# Patient Record
Sex: Male | Born: 1960
Health system: Southern US, Community
[De-identification: ages and names within clinical notes are randomized; demographics above are authoritative.]

## PROBLEM LIST (undated history)

## (undated) DIAGNOSIS — M1990 Unspecified osteoarthritis, unspecified site: Secondary | ICD-10-CM

## (undated) DIAGNOSIS — G4730 Sleep apnea, unspecified: Secondary | ICD-10-CM

## (undated) DIAGNOSIS — K635 Polyp of colon: Secondary | ICD-10-CM

## (undated) DIAGNOSIS — E781 Pure hyperglyceridemia: Secondary | ICD-10-CM

## (undated) DIAGNOSIS — R7303 Prediabetes: Secondary | ICD-10-CM

## (undated) DIAGNOSIS — F419 Anxiety disorder, unspecified: Secondary | ICD-10-CM

## (undated) DIAGNOSIS — I10 Essential (primary) hypertension: Secondary | ICD-10-CM

## (undated) HISTORY — DX: Polyp of colon: K63.5

## (undated) HISTORY — PX: POLYPECTOMY: SHX149

## (undated) HISTORY — DX: Prediabetes: R73.03

## (undated) HISTORY — PX: COLONOSCOPY: SHX174

## (undated) HISTORY — PX: SCALP LACERATION REPAIR: SHX6089

## (undated) HISTORY — DX: Unspecified osteoarthritis, unspecified site: M19.90

## (undated) HISTORY — DX: Anxiety disorder, unspecified: F41.9

## (undated) HISTORY — DX: Sleep apnea, unspecified: G47.30

## (undated) HISTORY — DX: Essential (primary) hypertension: I10

---

## 2000-09-12 ENCOUNTER — Encounter (INDEPENDENT_AMBULATORY_CARE_PROVIDER_SITE_OTHER): Payer: Self-pay | Admitting: *Deleted

## 2001-01-12 ENCOUNTER — Encounter: Payer: Self-pay | Admitting: Family Medicine

## 2001-01-12 ENCOUNTER — Encounter (INDEPENDENT_AMBULATORY_CARE_PROVIDER_SITE_OTHER): Payer: Self-pay | Admitting: *Deleted

## 2001-01-12 ENCOUNTER — Ambulatory Visit (HOSPITAL_COMMUNITY): Admission: RE | Admit: 2001-01-12 | Discharge: 2001-01-12 | Payer: Self-pay | Admitting: Family Medicine

## 2004-09-16 ENCOUNTER — Ambulatory Visit: Payer: Self-pay | Admitting: Internal Medicine

## 2006-04-27 ENCOUNTER — Ambulatory Visit: Payer: Self-pay | Admitting: Internal Medicine

## 2007-04-23 ENCOUNTER — Ambulatory Visit: Payer: Self-pay | Admitting: Family Medicine

## 2007-04-24 ENCOUNTER — Telehealth (INDEPENDENT_AMBULATORY_CARE_PROVIDER_SITE_OTHER): Payer: Self-pay | Admitting: *Deleted

## 2007-05-01 ENCOUNTER — Ambulatory Visit: Payer: Self-pay | Admitting: Family Medicine

## 2008-02-12 ENCOUNTER — Ambulatory Visit: Payer: Self-pay | Admitting: Internal Medicine

## 2008-02-14 ENCOUNTER — Encounter (INDEPENDENT_AMBULATORY_CARE_PROVIDER_SITE_OTHER): Payer: Self-pay | Admitting: *Deleted

## 2008-02-14 LAB — CONVERTED CEMR LAB
ALT: 26 units/L (ref 0–53)
AST: 20 units/L (ref 0–37)
Albumin: 4.4 g/dL (ref 3.5–5.2)
Alkaline Phosphatase: 57 units/L (ref 39–117)
BUN: 17 mg/dL (ref 6–23)
Basophils Absolute: 0 10*3/uL (ref 0.0–0.1)
Basophils Relative: 0.7 % (ref 0.0–1.0)
Bilirubin, Direct: 0.1 mg/dL (ref 0.0–0.3)
CO2: 28 meq/L (ref 19–32)
Calcium: 9.6 mg/dL (ref 8.4–10.5)
Chloride: 106 meq/L (ref 96–112)
Cholesterol: 172 mg/dL (ref 0–200)
Creatinine, Ser: 1.1 mg/dL (ref 0.4–1.5)
Eosinophils Absolute: 0 10*3/uL (ref 0.0–0.7)
Eosinophils Relative: 1 % (ref 0.0–5.0)
GFR calc Af Amer: 92 mL/min
GFR calc non Af Amer: 76 mL/min
Glucose, Bld: 105 mg/dL — ABNORMAL HIGH (ref 70–99)
HCT: 44 % (ref 39.0–52.0)
HDL: 33.6 mg/dL — ABNORMAL LOW (ref >39.0)
Hemoglobin: 15.3 g/dL (ref 13.0–17.0)
Iron: 110 ug/dL (ref 42–165)
LDL Cholesterol: 109 mg/dL — ABNORMAL HIGH (ref 0–99)
Lymphocytes Relative: 24.4 % (ref 12.0–46.0)
MCHC: 34.8 g/dL (ref 30.0–36.0)
MCV: 85.2 fL (ref 78.0–100.0)
Monocytes Absolute: 0.3 10*3/uL (ref 0.1–1.0)
Monocytes Relative: 5.7 % (ref 3.0–12.0)
Neutro Abs: 3 10*3/uL (ref 1.4–7.7)
Neutrophils Relative %: 68.2 % (ref 43.0–77.0)
PSA: 0.72 ng/mL (ref 0.10–4.00)
Platelets: 170 10*3/uL (ref 150–400)
Potassium: 4.4 meq/L (ref 3.5–5.1)
RBC: 5.17 M/uL (ref 4.22–5.81)
RDW: 12.5 % (ref 11.5–14.6)
Saturation Ratios: 26.8 % (ref 20.0–50.0)
Sodium: 141 meq/L (ref 135–145)
TSH: 1.61 microintl units/mL (ref 0.35–5.50)
Total Bilirubin: 1 mg/dL (ref 0.3–1.2)
Total CHOL/HDL Ratio: 5.1
Total Protein: 7.3 g/dL (ref 6.0–8.3)
Transferrin: 293.1 mg/dL (ref >=212.0)
Triglycerides: 145 mg/dL (ref 0–149)
VLDL: 29 mg/dL (ref 0–40)
WBC: 4.4 10*3/uL — ABNORMAL LOW (ref 4.5–10.5)

## 2008-12-03 HISTORY — PX: KNEE SURGERY: SHX244

## 2008-12-16 ENCOUNTER — Encounter: Payer: Self-pay | Admitting: Internal Medicine

## 2008-12-16 LAB — CONVERTED CEMR LAB
ALT: 119 units/L
AST: 60 units/L
Albumin: 4.8 g/dL
Alkaline Phosphatase: 86 units/L
BUN: 19 mg/dL
Cholesterol: 207 mg/dL
Creatinine, Ser: 1 mg/dL
Glucose, Bld: 101 mg/dL
HDL: 44 mg/dL
LDL Cholesterol: 119 mg/dL
Total Bilirubin: 0.4 mg/dL
Total Protein: 7.4 g/dL
Triglycerides: 213 mg/dL

## 2009-02-27 ENCOUNTER — Ambulatory Visit: Payer: Self-pay | Admitting: Internal Medicine

## 2009-03-04 ENCOUNTER — Telehealth (INDEPENDENT_AMBULATORY_CARE_PROVIDER_SITE_OTHER): Payer: Self-pay | Admitting: *Deleted

## 2009-03-04 ENCOUNTER — Encounter (INDEPENDENT_AMBULATORY_CARE_PROVIDER_SITE_OTHER): Payer: Self-pay | Admitting: *Deleted

## 2009-03-11 LAB — CONVERTED CEMR LAB
ALT: 22 units/L (ref 0–53)
AST: 21 units/L (ref 0–37)
Albumin: 4.3 g/dL (ref 3.5–5.2)
Alkaline Phosphatase: 59 units/L (ref 39–117)
BUN: 14 mg/dL (ref 6–23)
Basophils Absolute: 0 10*3/uL (ref 0.0–0.1)
Basophils Relative: 0.3 % (ref 0.0–3.0)
Bilirubin, Direct: 0 mg/dL (ref 0.0–0.3)
CO2: 29 meq/L (ref 19–32)
Calcium: 9.4 mg/dL (ref 8.4–10.5)
Chloride: 108 meq/L (ref 96–112)
Creatinine, Ser: 1 mg/dL (ref 0.4–1.5)
Eosinophils Absolute: 0 10*3/uL (ref 0.0–0.7)
Eosinophils Relative: 0.7 % (ref 0.0–5.0)
GFR calc non Af Amer: 84.72 mL/min (ref >60)
Glucose, Bld: 96 mg/dL (ref 70–99)
HCT: 40.8 % (ref 39.0–52.0)
Hemoglobin: 14 g/dL (ref 13.0–17.0)
Lymphocytes Relative: 24 % (ref 12.0–46.0)
Lymphs Abs: 1.2 10*3/uL (ref 0.7–4.0)
MCHC: 34.4 g/dL (ref 30.0–36.0)
MCV: 86.2 fL (ref 78.0–100.0)
Monocytes Absolute: 0.3 10*3/uL (ref 0.1–1.0)
Monocytes Relative: 6.2 % (ref 3.0–12.0)
Neutro Abs: 3.4 10*3/uL (ref 1.4–7.7)
Neutrophils Relative %: 68.8 % (ref 43.0–77.0)
PSA: 0.9 ng/mL (ref 0.10–4.00)
Platelets: 164 10*3/uL (ref 150.0–400.0)
Potassium: 3.9 meq/L (ref 3.5–5.1)
RBC: 4.73 M/uL (ref 4.22–5.81)
RDW: 12 % (ref 11.5–14.6)
Sodium: 143 meq/L (ref 135–145)
Total Bilirubin: 1 mg/dL (ref 0.3–1.2)
Total Protein: 7.1 g/dL (ref 6.0–8.3)
WBC: 4.9 10*3/uL (ref 4.5–10.5)

## 2010-04-02 ENCOUNTER — Ambulatory Visit: Payer: Self-pay | Admitting: Internal Medicine

## 2010-04-02 ENCOUNTER — Encounter: Payer: Self-pay | Admitting: Internal Medicine

## 2010-04-02 ENCOUNTER — Encounter (INDEPENDENT_AMBULATORY_CARE_PROVIDER_SITE_OTHER): Payer: Self-pay | Admitting: *Deleted

## 2010-04-09 LAB — CONVERTED CEMR LAB
ALT: 29 units/L (ref 0–53)
AST: 23 units/L (ref 0–37)
Albumin: 4.6 g/dL (ref 3.5–5.2)
Alkaline Phosphatase: 69 units/L (ref 39–117)
BUN: 16 mg/dL (ref 6–23)
Basophils Absolute: 0 10*3/uL (ref 0.0–0.1)
Basophils Relative: 0.4 % (ref 0.0–3.0)
Bilirubin, Direct: 0.1 mg/dL (ref 0.0–0.3)
CO2: 26 meq/L (ref 19–32)
Calcium: 9.7 mg/dL (ref 8.4–10.5)
Chloride: 104 meq/L (ref 96–112)
Cholesterol: 200 mg/dL (ref 0–200)
Creatinine, Ser: 1 mg/dL (ref 0.4–1.5)
Eosinophils Absolute: 0.1 10*3/uL (ref 0.0–0.7)
Eosinophils Relative: 1.1 % (ref 0.0–5.0)
GFR calc non Af Amer: 86.32 mL/min (ref >60)
Glucose, Bld: 95 mg/dL (ref 70–99)
HCT: 44.5 % (ref 39.0–52.0)
HDL: 34.3 mg/dL — ABNORMAL LOW (ref >39.00)
Hemoglobin: 15.4 g/dL (ref 13.0–17.0)
LDL Cholesterol: 133 mg/dL — ABNORMAL HIGH (ref 0–99)
Lymphocytes Relative: 21.8 % (ref 12.0–46.0)
Lymphs Abs: 1.3 10*3/uL (ref 0.7–4.0)
MCHC: 34.5 g/dL (ref 30.0–36.0)
MCV: 86.8 fL (ref 78.0–100.0)
Monocytes Absolute: 0.4 10*3/uL (ref 0.1–1.0)
Monocytes Relative: 6.2 % (ref 3.0–12.0)
Neutro Abs: 4.3 10*3/uL (ref 1.4–7.7)
Neutrophils Relative %: 70.5 % (ref 43.0–77.0)
PSA: 0.89 ng/mL (ref 0.10–4.00)
Platelets: 174 10*3/uL (ref 150.0–400.0)
Potassium: 4.2 meq/L (ref 3.5–5.1)
RBC: 5.13 M/uL (ref 4.22–5.81)
RDW: 13.4 % (ref 11.5–14.6)
Sodium: 140 meq/L (ref 135–145)
TSH: 1.94 microintl units/mL (ref 0.35–5.50)
Total Bilirubin: 1 mg/dL (ref 0.3–1.2)
Total CHOL/HDL Ratio: 6
Total Protein: 7.6 g/dL (ref 6.0–8.3)
Triglycerides: 163 mg/dL — ABNORMAL HIGH (ref 0.0–149.0)
VLDL: 32.6 mg/dL (ref 0.0–40.0)
WBC: 6.1 10*3/uL (ref 4.5–10.5)

## 2010-05-06 ENCOUNTER — Telehealth: Payer: Self-pay | Admitting: Internal Medicine

## 2010-05-18 ENCOUNTER — Ambulatory Visit: Payer: Self-pay | Admitting: Internal Medicine

## 2010-05-18 DIAGNOSIS — R195 Other fecal abnormalities: Secondary | ICD-10-CM | POA: Insufficient documentation

## 2010-06-29 ENCOUNTER — Telehealth: Payer: Self-pay | Admitting: Internal Medicine

## 2010-07-01 ENCOUNTER — Ambulatory Visit: Payer: Self-pay | Admitting: Internal Medicine

## 2010-07-05 ENCOUNTER — Encounter: Payer: Self-pay | Admitting: Internal Medicine

## 2010-08-18 ENCOUNTER — Telehealth: Payer: Self-pay | Admitting: Internal Medicine

## 2010-09-14 NOTE — Letter (Signed)
Summary: Primary Care Consult Scheduled Letter  Joe Blanchard at [place]  [address] [place], [place] [address]   Phone: [phone]  Fax: [phone]      04/02/2010 MRN: [id number]  [facility] [address] [place], [place]  [address]    Dear Joe Blanchard,    We have scheduled an appointment for you.  At the recommendation of Dr. Willow Ora, we have scheduled you a consult with Dr. Yancey Flemings of [facility] on 05-18-2010 at 2:45pm.  Their address is [address] Floor, [place] [address]. The office phone number is [phone].  If this appointment day and time is not convenient for you, please feel free to call the office of the doctor you are being referred to at the number listed above and reschedule the appointment.    It is important for you to keep your scheduled appointments. We are here to make sure you are given good patient care.   Thank you,    Renee, Patient Care Coordinator  at [facility]

## 2010-09-14 NOTE — Letter (Signed)
Summary: Patient Notice- Polyp Results  [facility]  [address] [place], [place] [address]   Phone: [phone]  Fax: [phone]        July 05, 2010 MRN: [id number]    Joe Blanchard [address] [place], [place]  [address]    Dear [name],  I am pleased to inform you that the colon polyp(s) removed during your recent colonoscopy was (were) found to be benign (no cancer detected) upon pathologic examination.  I recommend you have a repeat colonoscopy examination in ONE year to look for recurrent polyps, as having colon polyps increases your risk for having recurrent polyps or even colon cancer in the future.  Should you develop new or worsening symptoms of abdominal pain, bowel habit changes or bleeding from the rectum or bowels, please schedule an evaluation with either your primary care physician or with me.    Additional information/recommendations:  __ No further action with gastroenterology is needed at this time. Please      follow-up with your primary care physician for your other healthcare      needs.   Please call us if you are having persistent problems or have questions about your condition that have not been fully answered at this time.  Sincerely,  Hilarie Fredrickson MD  This letter has been electronically signed by your physician.  Appended Document: Patient Notice- Polyp Results Letter mailed

## 2010-09-14 NOTE — Procedures (Signed)
Summary: Colonoscopy   Colonoscopy  Procedure date:  09/12/2000  Findings:      Location:  [facility].  Results: Diverticulosis.        Patient Name: Joe Blanchard, Joe Blanchard MRN:  Procedure Procedures: Colonoscopy CPT: [phone].  Personnel: Endoscopist: Wilhemina Bonito. Marina Goodell, MD.  Referred By: Angelena Sole, MD.  Exam Location: Exam performed in Outpatient Clinic. Outpatient  Patient Consent: Procedure, Alternatives, Risks and Benefits discussed, consent obtained, from patient.  Indications  Evaluation of: Positive fecal occult blood test per digital rectal exam.  Symptoms: Abdominal pain / bloating.  Increased Risk Screening: Family History of Polyps.  History  Pre-Exam Physical: Performed Sep 12, 2000. Cardio-pulmonary exam, Rectal exam, HEENT exam , Abdominal exam, Extremity exam, Neurological exam, Mental status exam WNL.  Exam Exam: Extent of exam reached: Ileum, extent intended: Ileum.  The cecum was identified by appendiceal orifice and IC valve. Patient position: on left side. Colon retroflexion performed. Images were not taken. ASA Classification: I. Tolerance: excellent.  Monitoring: Pulse and BP monitoring, Oximetry used. Supplemental O2 given.  Colon Prep Used Golytely for colon prep. Prep results: excellent.  Sedation Meds: Fentanyl 100 mcg. Versed 10 mg.  Findings - DIVERTICULOSIS: Sigmoid Colon. ICD9: Diverticulosis, Colon: 562.10. Comments: mild diverticular change only.   Assessment Abnormal examination, see findings above.  Diagnoses: 562.10: Diverticulosis, Colon.   Comments: No explaination for symptoms  Events  Unplanned Interventions: No intervention was required.  Unplanned Events: There were no complications. Plans Medication Plan: Referring provider to order medications.  Patient Education: Patient given standard instructions for: Diverticulosis.  Disposition: After procedure patient sent to recovery. After recovery  patient sent home.  Comments: Return to the care of Dr. Ruthine Dose.  This report was created from the original endoscopy report, which was reviewed and signed by the above listed endoscopist.   cc:  Lutricia Horsfall, MD

## 2010-09-14 NOTE — Assessment & Plan Note (Signed)
Summary: cpx & lab/cbs/rescd cbs   Vital Signs:  Patient profile:   50 year old male Height:      71.25 inches Weight:      208 pounds BMI:     28.91 Pulse rate:   64 / minute Pulse rhythm:   regular BP sitting:   116 / 80  (left arm) Cuff size:   large  Vitals Entered By: Army Fossa CMA (April 02, 2010 8:07 AM)  CC: CPX: fasting   History of Present Illness: CPX  Current Medications (verified): 1)  Aspirin 81 Mg Tbec (Aspirin) .Marland Kitchen.. 1 By Mouth Once Daily  Allergies (verified): No Known Drug Allergies  Past History:  Past Medical History: Reviewed history from 02/12/2008 and no changes required. no  Past Surgical History: Reviewed history from 02/27/2009 and no changes required. left knee scope 12/03/2008  Family History: Reviewed history from 02/12/2008 and no changes required. 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 CV:  Denies chest pain or discomfort and swelling of feet. Resp:  Denies cough and shortness of breath. GI:  Denies bloody stools, diarrhea, nausea, and vomiting. GU:  Denies dysuria, hematuria, urinary frequency, and urinary hesitancy. Psych:  ++ stress , work, family life  .  Physical Exam  General:  alert, well-developed, and well-nourished.   Eyes:  no pale or icteric Neck:  no masses, no thyromegaly, and normal carotid upstroke.   Lungs:  normal respiratory effort, no intercostal retractions, no accessory muscle use, and normal breath sounds.   Heart:  normal rate, regular rhythm, no murmur, and no gallop.   Abdomen:  soft, non-tender, no distention,  no masses, no guarding, and no rigidity.   Rectal:  No external abnormalities noted. Normal sphincter tone. No rectal masses or tenderness. hemocult  + Prostate:  Prostate gland firm and smooth, no enlargement, nodularity, tenderness, mass, asymmetry or induration. Extremities:  no lower extremity edema Psych:  Cognition and judgment appear intact. Alert and cooperative with normal attention span and concentration.  not anxious appearing and not depressed appearing.     Impression & Recommendations:  Problem # 1:  HEALTH SCREENING (ICD-V70.0) Td 2002   has a family history of prostate cancer --labs   + FH colon cancer, he was somehow reluctant to have a Cscope d/t cost however  today he is Hemoccult positive; last colonoscopy about 9 years ago. Refer to GI. told patient to call if he doesn't hear from [place] in 1 week    He has gained several pounds ; states that one of his problems is eating out. Counseled. encouraged to continue walking daily  Orders: Venipuncture (78469) TLB-BMP (Basic Metabolic Panel-BMET) (80048-METABOL) TLB-CBC Platelet - w/Differential (85025-CBCD) TLB-Hepatic/Liver Function Pnl (80076-HEPATIC) TLB-Lipid Panel (80061-LIPID) TLB-TSH (Thyroid Stimulating Hormone) (84443-TSH) TLB-PSA (Prostate Specific Antigen) (84153-PSA) Gastroenterology Referral (GI)  Complete Medication List: 1)  Aspirin 81 Mg Tbec (Aspirin) .Marland Kitchen.. 1 by mouth once daily  Patient Instructions: 1)  Please schedule a follow-up appointment in 1 year.    Risk Factors:  Alcohol use:  yes

## 2010-09-14 NOTE — Letter (Signed)
Summary: [facility] Instructions  [facility]  [address] [place], [place] [address]   Phone: [phone]  Fax: [phone]       REID REGAS    December 08, 1960    MRN: [id number]        Procedure Day /Date:THURSDAY 07/01/10     Arrival Time:12:30 PM     Procedure Time:1:30 PM     Location of Procedure:                    X  [facility] (4th Floor)  PREPARATION FOR COLONOSCOPY WITH MOVIPREP   Starting 5 days prior to your procedure 06/26/10 do not eat nuts, seeds, popcorn, corn, beans, peas,  salads, or any raw vegetables.  Do not take any fiber supplements (e.g. Metamucil, Citrucel, and Benefiber).  THE DAY BEFORE YOUR PROCEDURE         DATE: 06/30/10  DAY: WEDNESDAY  1.  Drink clear liquids the entire day-NO SOLID FOOD  2.  Do not drink anything colored red or purple.  Avoid juices with pulp.  No orange juice.  3.  Drink at least 64 oz. (8 glasses) of fluid/clear liquids during the day to prevent dehydration and help the prep work efficiently.  CLEAR LIQUIDS INCLUDE: Water Jello Ice Popsicles Tea (sugar ok, no milk/cream) Powdered fruit flavored drinks Coffee (sugar ok, no milk/cream) Gatorade Juice: apple, white grape, white cranberry  Lemonade Clear bullion, consomm, broth Carbonated beverages (any kind) Strained chicken noodle soup Hard Candy                             4.  In the morning, mix first dose of MoviPrep solution:    Empty 1 Pouch A and 1 Pouch B into the disposable container    Add lukewarm drinking water to the top line of the container. Mix to dissolve    Refrigerate (mixed solution should be used within 24 hrs)  5.  Begin drinking the prep at 5:00 p.m. The MoviPrep container is divided by 4 marks.   Every 15 minutes drink the solution down to the next mark (approximately 8 oz) until the full liter is complete.   6.  Follow completed prep with 16 oz of clear liquid of your choice (Nothing red or purple).   Continue to drink clear liquids until bedtime.  7.  Before going to bed, mix second dose of MoviPrep solution:    Empty 1 Pouch A and 1 Pouch B into the disposable container    Add lukewarm drinking water to the top line of the container. Mix to dissolve    Refrigerate  THE DAY OF YOUR PROCEDURE      DATE: 07/01/10 ONG:EXBMWUXL  Beginning at 8:30 a.m. (5 hours before procedure):         1. Every 15 minutes, drink the solution down to the next mark (approx 8 oz) until the full liter is complete.  2. Follow completed prep with 16 oz. of clear liquid of your choice.    3. You may drink clear liquids until 11:30 AM (2 HOURS BEFORE PROCEDURE).   MEDICATION INSTRUCTIONS  Unless otherwise instructed, you should take regular prescription medications with a small sip of water   as early as possible the morning of your procedure.           OTHER INSTRUCTIONS  You will need a responsible adult at least 50 years of age  to accompany you and drive you home.   This person must remain in the waiting room during your procedure.  Wear loose fitting clothing that is easily removed.  Leave jewelry and other valuables at home.  However, you may wish to bring a book to read or  an iPod/MP3 player to listen to music as you wait for your procedure to start.  Remove all body piercing jewelry and leave at home.  Total time from sign-in until discharge is approximately 2-3 hours.  You should go home directly after your procedure and rest.  You can resume normal activities the  day after your procedure.  The day of your procedure you should not:   Drive   Make legal decisions   Operate machinery   Drink alcohol   Return to work  You will receive specific instructions about eating, activities and medications before you leave.    The above instructions have been reviewed and explained to me by   _______________________    I fully understand and can verbalize these instructions  _____________________________ Date _________

## 2010-09-14 NOTE — Assessment & Plan Note (Signed)
Summary: Screening colonoscopy / heme positive stool   History of Present Illness Visit Type: Initial Consult Primary GI MD: Yancey Flemings MD Primary Provider: Willow Ora, MD Requesting Provider: Willow Ora, MD Chief Complaint: Heme pos stools with family history of colon cancer. Pt denies any GI sx.  History of Present Illness:   Pleasant 50 year old with no significant past medical history. He sent today regarding the interval development of colon cancer in his family as well as Hemoccult-positive stool on physical exam. The patient was last seen in 2002 and he was referred for colonoscopy to evaluate Hemoccult-positive stool. The examination was normal except for diverticulosis. Since that time, his father was diagnosed with colon cancer at age 33 and died from the disease at age 29. Patient recently underwent routine physical exam including digital rectal. Trace blood on digital rectal noted. Patient's laboratories, including hemoglobin of 15.4, were normal. He does take a baby aspirin daily for "health reasons". His GI review of systems is entirely negative.Marland Kitchen   GI Review of Systems      Denies abdominal pain, acid reflux, belching, bloating, chest pain, dysphagia with liquids, dysphagia with solids, heartburn, loss of appetite, nausea, vomiting, vomiting blood, weight loss, and  weight gain.      Reports heme positive stool.     Denies anal fissure, black tarry stools, change in bowel habit, constipation, diarrhea, diverticulosis, fecal incontinence, hemorrhoids, irritable bowel syndrome, jaundice, light color stool, liver problems, rectal bleeding, and  rectal pain.    Current Medications (verified): 1)  Aspirin 81 Mg Tbec (Aspirin) .Marland Kitchen.. 1 By Mouth Once Daily  Allergies (verified): No Known Drug Allergies  Past History:  Past Medical History: no Anxiety Disorder  Past Surgical History: Reviewed history from 02/27/2009 and no changes required. left knee scope 12/03/2008  Family  History: HTN - no stroke - no DM - no MI - GF (old) colon Ca - father dx at age 51 prostate Ca- GF, F colon polyps--brother, sister sister-- + lupus anticoagulant  Social History: Reviewed history from 04/02/2010 and no changes required. 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  The patient denies allergy/sinus, anemia, arthritis/joint pain, back pain, blood in urine, breast changes/lumps, change in vision, confusion, cough, coughing up blood, depression-new, fainting, fatigue, fever, headaches-new, hearing problems, heart murmur, heart rhythm changes, itching, menstrual pain, muscle pains/cramps, night sweats, nosebleeds, pregnancy symptoms, shortness of breath, skin rash, sore throat, swelling of feet/legs, swollen lymph glands, thirst - excessive , urination - excessive , urination changes/pain, urine leakage, vision changes, and voice change.    Vital Signs:  Patient profile:   50 year old male Height:      71.25 inches Weight:      212 pounds BMI:     29.47 Pulse rate:   66 / minute Pulse rhythm:   regular BP sitting:   110 / 70  (right arm) Cuff size:   regular  Vitals Entered By: Christie Nottingham CMA Duncan Dull) (May 18, 2010 2:51 PM)  Physical Exam  General:  Well developed, well nourished, no acute distress. Head:  Normocephalic and atraumatic. Eyes:  PERRLA, no icterus. Ears:  Normal auditory acuity. Nose:  No deformity, discharge,  or lesions. Mouth:  No deformity or lesions, dentition normal.  Neck:  Supple; no masses or thyromegaly. Lungs:  Clear throughout to auscultation. Heart:  Regular rate and rhythm; no murmurs, rubs,  or bruits. Abdomen:  Soft, nontender and nondistended. No masses, hepatosplenomegaly or hernias noted. Normal  bowel sounds. Rectal:  deferred until colonoscopy Msk:  Symmetrical with no gross deformities. Normal posture. Pulses:  Normal pulses noted. Extremities:  No clubbing, cyanosis, edema or deformities noted. Neurologic:  Alert and  oriented x4. Skin:  Intact without significant lesions or rashes. Psych:  Alert and cooperative. Normal mood and affect.   Impression & Recommendations:  Problem # 1:  FAMILY HX COLON CANCER (ICD-V16.0)  colon cancer in first-degree relative at age 24. Patient is high risk screening  Problem # 2:  SCREENING COLORECTAL-CANCER (ICD-V76.12)  near 50 year old with colon cancer in a first-degree relative. Last colonoscopy close to a decade ago. Appropriate candidate for high-risk screening without contraindication. The nature of the procedure as well as the risks, benefits, and alternatives were reviewed. He understood and agreed to proceed. Movi prep prescribed. Patient instructed on its use  Problem # 3:  FECAL OCCULT BLOOD (ICD-792.1)  trace Hemoccult-positive stool on digital rectal exam. Negative GI review of systems. Normal hemoglobin. On aspirin. Clinical significance uncertain. May be secondary to digital trauma, aspirin, or occult GI pathology.  Plan: #1. Can evaluate further with colonoscopy that is being performed for screening purposes  Patient Instructions: 1)  Colonoscopy [facility] 07/01/10 1:30 pm arrive at 12:30 pm 2)  Movi prep instructions given 3)  Movi prep Rx. sent to pharmacy. 4)  Colonoscopy and Flexible Sigmoidoscopy brochure given.  5)  Copy sent to : Willow Ora, MD 6)  The medication list was reviewed and reconciled.  All changed / newly prescribed medications were explained.  A complete medication list was provided to the patient / caregiver. Prescriptions: MOVIPREP 100 GM  SOLR (PEG-KCL-NACL-NASULF-NA ASC-C) As per prep instructions.  #1 x 0   Entered by:   Milford Cage NCMA   Authorized by:   Hilarie Fredrickson MD   Signed by:   Milford Cage  NCMA on 05/18/2010   Method used:   Electronically to        [facility] [phone]* (retail)       [address]       [place], [place]  [address]       Ph: [phone]       Fax: [phone]   RxID:   [id number]

## 2010-09-14 NOTE — Progress Notes (Signed)
Summary: Procedure Question  Phone Note Call from Patient Call back at Work Phone [phone]   Caller: Patient Call For: Dr. Marina Goodell Reason for Call: Talk to Nurse Details for Reason: Procedure Question Summary of Call: Patient has procedure scheduled for Thursday.  Ate a "Big Mac" last night.  Wants to know if that messes up anything.  Please call and advise. Initial call taken by: Schuyler Amor,  June 29, 2010 8:28 AM  Follow-up for Phone Call        Message left to call back.   Teryl Lucy RN  June 29, 2010 12:37 PM Pt. assured that sesame seeds on the bun will not interfere with colon but  he is to avoid any more. Follow-up by: Teryl Lucy RN,  June 29, 2010 4:30 PM

## 2010-09-14 NOTE — Progress Notes (Signed)
Summary: sch procedure?  Phone Note Call from Patient Call back at Work Phone [phone]   Caller: Patient Call For: Dr. Marina Goodell Reason for Call: Talk to Nurse Summary of Call: pt want to know if he can go ahead and sch a COL now before he has his ov with Dr. Marina Goodell so that he doesnt have to wait so long for the procedure, it would already be booked Initial call taken by: Vallarie Mare,  May 06, 2010 12:00 PM  Follow-up for Phone Call        Pt. assured that colon can be scheduled at the office visit to be done this year.He thought maybe there was no available dates for a couple of months. Follow-up by: Teryl Lucy RN,  May 06, 2010 12:43 PM

## 2010-09-14 NOTE — Letter (Signed)
Summary: New Patient letter  [facility]  [address] [place], [place] [address]   Phone: [phone]  Fax: [phone]       04/02/2010 MRN: [id number]  [facility] [address] [place], [place]  [address]  Dear Mr. Kellett,  Welcome to the Gastroenterology Division at [facility].    You are scheduled to see Dr.  Marina Goodell on 05-18-10 at 2:45pm on the 3rd floor at [facility], [address]  We ask that you try to arrive at our office 15 minutes prior to your appointment time to allow for check-in.  We would like you to complete the enclosed self-administered evaluation form prior to your visit and bring it with you on the day of your appointment.  We will review it with you.  Also, please bring a complete list of all your medications or, if you prefer, bring the medication bottles and we will list them.  Please bring your insurance card so that we may make a copy of it.  If your insurance requires a referral to see a specialist, please bring your referral form from your primary care physician.  Co-payments are due at the time of your visit and may be paid by cash, check or credit card.     Your office visit will consist of a consult with your physician (includes a physical exam), any laboratory testing he/she may order, scheduling of any necessary diagnostic testing (e.g. x-ray, ultrasound, CT-scan), and scheduling of a procedure (e.g. Endoscopy, Colonoscopy) if required.  Please allow enough time on your schedule to allow for any/all of these possibilities.    If you cannot keep your appointment, please call [phone] to cancel or reschedule prior to your appointment date.  This allows [place] the opportunity to schedule an appointment for another patient in need of care.  If you do not cancel or reschedule by 5 p.m. the business day prior to your appointment date, you will be charged a $50.00 late cancellation/no-show fee.    Thank you for choosing  [facility] for your medical needs.  We appreciate the opportunity to care for you.  Please visit [place] at our website  to learn more about our practice.                     Sincerely,                                                             The Gastroenterology Division

## 2010-09-14 NOTE — Procedures (Signed)
Summary: Colonoscopy  Patient: Joe Blanchard Note: All result statuses are Final unless otherwise noted.  Tests: (1) Colonoscopy (COL)   COL Colonoscopy           DONE     [facility]     [address]     [place], [place]  [address]           COLONOSCOPY PROCEDURE REPORT           PATIENT:  Joe Blanchard, Joe Blanchard  MR#:  [id number]     BIRTHDATE:  [birth year], 49 yrs. old  GENDER:  male     ENDOSCOPIST:  Wilhemina Bonito. Eda Keys, MD     REF. BY:  Willow Ora, MD     PROCEDURE DATE:  07/01/2010     PROCEDURE:  Colonoscopy with biopsies,     Colonoscopy with snare polypectomy     x 9     EXTENDED SERVICE FOR TIME (30     MINUTES) AND MULTIPLE POLYPS (9)     ASA CLASS:  Class I     INDICATIONS:  family history of colon cancer, screening, heme     positive stool ; father w/ CRC at 62     MEDICATIONS:   Fentanyl 100 mcg IV, Versed 10 mg IV, Benadryl 50     mg IV           DESCRIPTION OF PROCEDURE:   After the risks benefits and     alternatives of the procedure were thoroughly explained, informed     consent was obtained.  Digital rectal exam was performed and     revealed no abnormalities.   The LB CF-H180AL [id number] endoscope     was introduced through the anus and advanced to the cecum, which     was identified by both the appendix and ileocecal valve, without     limitations.Time to cecum = 1:58 min. The quality of the prep was     excellent, using MoviPrep.  The instrument was then slowly     withdrawn (time = 26:13 min) as the colon was fully examined.     <<PROCEDUREIMAGES>>           FINDINGS:  There were NINE polyps identified and removed from the     cecum (77mm,2mm), ascending (81mm,7mm), transverse (56mm,3mm,4mm) and     rectosigmoid (41mm,5mm) colon. Polyps were snared without cautery.     Retrieval was successful.  Also, Abnormal appearing mucosa in the     rectum along the second fold where one polyp was removed. Multiple     bx taken to r/o adenomatous tissue.  Mild  diverticulosis was found     in the sigmoid colon.  The terminal ileum appeared normal.     Retroflexed views in the rectum revealed no abnormalities.    The     scope was then withdrawn from the patient and the procedure     completed.           COMPLICATIONS:  None           ENDOSCOPIC IMPRESSION:     1) Polyps, multiple (9) - removed     2) Abnormal mucosa in the rectum - bx     3) Mild diverticulosis in the sigmoid colon     4) Normal terminal ileum           RECOMMENDATIONS:     1) Repeat Colonoscopy in 1 year (if rectal bx nonadenomatous).  ______________________________     Wilhemina Bonito. Eda Keys, MD           CC:  Willow Ora, MD; The Patient           n.     eSIGNED:   Wilhemina Bonito. Eda Keys at 07/01/2010 02:39 PM           Edmonds, Cillian, [place] [id number]  Note: An exclamation mark (!) indicates a result that was not dispersed into the flowsheet. Document Creation Date: 07/01/2010 2:39 PM _______________________________________________________________________  (1) Order result status: Final Collection or observation date-time: 07/01/2010 14:19 Requested date-time:  Receipt date-time:  Reported date-time:  Referring Physician:   Ordering Physician: Fransico Setters [phone]) Specimen Source:  Source: Launa Grill Order Number: [id number] Lab site:   Appended Document: Colonoscopy recall ONE yr     Procedures Next Due Date:    Colonoscopy: 06/2011

## 2010-09-16 NOTE — Progress Notes (Signed)
Summary: speak to nurse  Phone Note Call from Patient Call back at Work Phone [phone]   Caller: Patient Call For: Dr Marina Goodell Reason for Call: Talk to Nurse Summary of Call: Patient wants to speak to nurse regarding his results Initial call taken by: Tawni Levy,  August 18, 2010 2:26 PM  Follow-up for Phone Call        Message left to call back.   Teryl Lucy RN  August 18, 2010 2:31 PM  Explained path. results  AV:WUJW of inflammation. Follow-up by: Teryl Lucy RN,  August 18, 2010 2:42 PM

## 2011-04-20 ENCOUNTER — Encounter: Payer: Self-pay | Admitting: Internal Medicine

## 2011-04-21 ENCOUNTER — Encounter: Payer: Self-pay | Admitting: Internal Medicine

## 2011-04-21 ENCOUNTER — Ambulatory Visit (INDEPENDENT_AMBULATORY_CARE_PROVIDER_SITE_OTHER): Payer: 59 | Admitting: Internal Medicine

## 2011-04-21 DIAGNOSIS — Z0000 Encounter for general adult medical examination without abnormal findings: Secondary | ICD-10-CM | POA: Insufficient documentation

## 2011-04-21 DIAGNOSIS — F419 Anxiety disorder, unspecified: Secondary | ICD-10-CM | POA: Insufficient documentation

## 2011-04-21 DIAGNOSIS — Z23 Encounter for immunization: Secondary | ICD-10-CM

## 2011-04-21 DIAGNOSIS — F411 Generalized anxiety disorder: Secondary | ICD-10-CM

## 2011-04-21 LAB — COMPREHENSIVE METABOLIC PANEL
Albumin: 4.7 g/dL (ref 3.5–5.2)
CO2: 25 mEq/L (ref 19–32)
GFR: 111.85 mL/min (ref >60.00)
Glucose, Bld: 114 mg/dL — ABNORMAL HIGH (ref 70–99)
Sodium: 141 mEq/L (ref 135–145)
Total Bilirubin: 0.9 mg/dL (ref 0.3–1.2)
Total Protein: 7.6 g/dL (ref 6.0–8.3)

## 2011-04-21 LAB — CBC WITH DIFFERENTIAL/PLATELET
Eosinophils Relative: 0.9 % (ref 0.0–5.0)
HCT: 46.8 % (ref 39.0–52.0)
Hemoglobin: 15.8 g/dL (ref 13.0–17.0)
Lymphs Abs: 1.2 10*3/uL (ref 0.7–4.0)
Monocytes Relative: 6.1 % (ref 3.0–12.0)
Neutro Abs: 3.7 10*3/uL (ref 1.4–7.7)
Platelets: 177 10*3/uL (ref 150.0–400.0)
RBC: 5.42 Mil/uL (ref 4.22–5.81)
WBC: 5.3 10*3/uL (ref 4.5–10.5)

## 2011-04-21 LAB — LIPID PANEL
Cholesterol: 209 mg/dL — ABNORMAL HIGH (ref 0–200)
VLDL: 50.4 mg/dL — ABNORMAL HIGH (ref 0.0–40.0)

## 2011-04-21 LAB — TSH: TSH: 1.76 u[IU]/mL (ref 0.35–5.50)

## 2011-04-21 LAB — LDL CHOLESTEROL, DIRECT: Direct LDL: 129 mg/dL

## 2011-04-21 MED ORDER — CITALOPRAM HYDROBROMIDE 20 MG PO TABS: 20.0000 mg | ORAL_TABLET | Freq: Every day | ORAL | Status: DC

## 2011-04-21 NOTE — Progress Notes (Signed)
  Subjective:    Patient ID: Joe Blanchard, male    DOB: [birth year], 50 y.o.   MRN: [id number]  HPI CPX C/o increased stress at work, very anxious, unable to sleep more than 5 hours at night (this is a consistent issue) No depression but emotional, no suicidal. SSRI?  Past Medical History  Diagnosis Date  . Anxiety   . Colon polyps     Cscope 11-11, next 06-2011   Past Surgical History  Procedure Date  . 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 Fatigue: occ fatigue.  Respiratory: Negative for cough and wheezing.   Cardiovascular: Negative for chest pain and leg swelling.  Gastrointestinal: Negative for abdominal pain and blood in stool.  Genitourinary: Negative for dysuria, hematuria and difficulty urinating.  Psychiatric/Behavioral:               Objective:   Physical Exam  Constitutional: He is oriented to person, place, and time. He appears well-developed and well-nourished.  HENT:  Head: Normocephalic and atraumatic.  Neck: No thyromegaly present.  Cardiovascular: Normal rate, regular rhythm and normal heart sounds.   No murmur heard. Pulmonary/Chest: Effort normal and breath sounds normal. No respiratory distress. He has no wheezes. He has no rales.  Abdominal: Soft. He exhibits no distension. There is no tenderness. There is no rebound and no guarding.  Genitourinary: Rectum normal and prostate normal.  Musculoskeletal: He exhibits no edema.  Neurological: He is alert and oriented to person, place, and time.    Skin: Skin is warm and dry.  Psychiatric: He has a normal mood and affect. His behavior is normal. Judgment and thought content normal.          Assessment & Plan:

## 2011-04-21 NOTE — Assessment & Plan Note (Addendum)
New problem Anxiety, insomnia x > 1 year, job related We discussed treatment of insomnia only versus SSRIs, he favors SSRIs: start citalopram, s/e discussed. See instructions

## 2011-04-21 NOTE — Patient Instructions (Signed)
Start citalopram : 1/2 tablet a day x 1 week, then 1 tab a day. Take at bedtime

## 2011-04-21 NOTE — Assessment & Plan Note (Addendum)
Td 2002 and today + family history of prostate cancer --labs , normal DRE  + FH colon cancer, Cscope 11-11, next 11-12 or 12-2011? Pt not sure, will call gi Weight stable compared to last year; encouraged to continue  to eat healthy, exercise (has knee pain:knee sleeve?)

## 2011-04-25 ENCOUNTER — Telehealth: Payer: Self-pay | Admitting: *Deleted

## 2011-04-25 DIAGNOSIS — R739 Hyperglycemia, unspecified: Secondary | ICD-10-CM

## 2011-04-25 NOTE — Telephone Encounter (Signed)
Message copied by Regis Bill on Mon Apr 25, 2011  3:09 PM ------      Message from: Joe Blanchard      Created: Sun Apr 24, 2011 11:13 AM       advise patient      Sugar slightly elevated, please add a hemoglobin A1c ---DX hyperglycemia      Triglycerides 252, hydrocodone before, a healthy diet and daily exercise will help bring this number  down .      Other labs normal.

## 2011-04-25 NOTE — Telephone Encounter (Signed)
LMOM to inform Patient. A1C lab ordered. Results Mailed.

## 2011-04-26 ENCOUNTER — Telehealth: Payer: Self-pay

## 2011-04-26 NOTE — Telephone Encounter (Signed)
Dr. Drue Novel spoke with Dr. Marina Goodell regarding when the pt is due for his colon. Per Dr. Marina Goodell pt should be due in Nov 2012. Pt notified. Pt will receive a letter when due. Pt requests that we call him when Nov schedule opens up to schedule his appt.

## 2011-05-19 ENCOUNTER — Other Ambulatory Visit: Payer: Self-pay | Admitting: Internal Medicine

## 2011-05-19 DIAGNOSIS — R7309 Other abnormal glucose: Secondary | ICD-10-CM

## 2011-05-20 ENCOUNTER — Other Ambulatory Visit (INDEPENDENT_AMBULATORY_CARE_PROVIDER_SITE_OTHER): Payer: 59

## 2011-05-20 DIAGNOSIS — R7309 Other abnormal glucose: Secondary | ICD-10-CM

## 2011-05-20 LAB — HEMOGLOBIN A1C: Hgb A1c MFr Bld: 6.6 % — ABNORMAL HIGH (ref 4.6–6.5)

## 2011-05-20 NOTE — Progress Notes (Signed)
Labs only

## 2011-05-22 ENCOUNTER — Telehealth: Payer: Self-pay | Admitting: Internal Medicine

## 2011-05-22 DIAGNOSIS — R7303 Prediabetes: Secondary | ICD-10-CM

## 2011-05-22 NOTE — Telephone Encounter (Signed)
Advise patient: Labs did show mild DM; treatment is a healthy diet and exercise daily Plan: 1. Refer to a nutritionist 2. RTC 3 months for a ROV

## 2011-05-23 NOTE — Telephone Encounter (Signed)
Patient's wife informed. [Pt at work]. Nutritionist referral done. Pt will call back to schedule ROV.

## 2011-06-24 ENCOUNTER — Ambulatory Visit (INDEPENDENT_AMBULATORY_CARE_PROVIDER_SITE_OTHER): Payer: 59 | Admitting: Internal Medicine

## 2011-06-24 ENCOUNTER — Encounter: Payer: Self-pay | Admitting: Internal Medicine

## 2011-06-24 DIAGNOSIS — E119 Type 2 diabetes mellitus without complications: Secondary | ICD-10-CM

## 2011-06-24 DIAGNOSIS — E785 Hyperlipidemia, unspecified: Secondary | ICD-10-CM | POA: Insufficient documentation

## 2011-06-24 DIAGNOSIS — R7303 Prediabetes: Secondary | ICD-10-CM

## 2011-06-24 DIAGNOSIS — F419 Anxiety disorder, unspecified: Secondary | ICD-10-CM

## 2011-06-24 DIAGNOSIS — F411 Generalized anxiety disorder: Secondary | ICD-10-CM

## 2011-06-24 DIAGNOSIS — R739 Hyperglycemia, unspecified: Secondary | ICD-10-CM | POA: Insufficient documentation

## 2011-06-24 HISTORY — DX: Prediabetes: R73.03

## 2011-06-24 MED ORDER — ZOLPIDEM TARTRATE 10 MG PO TABS: 10.0000 mg | ORAL_TABLET | Freq: Every evening | ORAL | Status: DC | PRN

## 2011-06-24 NOTE — Assessment & Plan Note (Signed)
Triglycerides elevated, diet and exercise discussed

## 2011-06-24 NOTE — Patient Instructions (Signed)
"  Diabetes for Dummies", great book Exercise 30 minutes every day!

## 2011-06-24 NOTE — Assessment & Plan Note (Addendum)
Lengthy  discussion about what is diabetes, meaning an A1c, diet, exercise. Abundant informational material provided

## 2011-06-24 NOTE — Progress Notes (Signed)
  Subjective:    Patient ID: Joe Blanchard, male    DOB: [birth year], 50 y.o.   MRN: [id number]  HPI Followup from last office visit Started citalopram, it works very well. Still has issues with insomnia, is unable to sleep more than 4 hours a day. He was also diagnosed with diabetes with an A1c of 6.6. His triglycerides were elevated.  Past Medical History  Diagnosis Date  . Anxiety   . Colon polyps     Cscope 11-11, next 06-2011   Past Surgical History  Procedure Date  . Knee surgery 12/03/08    scope     Review of Systems     Objective:   Physical Exam  A, ox3, NAD      Assessment & Plan:  Today , I spent more than 25  min with the patient, >50% of the time counseling, and reviewing his previous labs with him

## 2011-06-24 NOTE — Assessment & Plan Note (Signed)
Improved RF citalopram Add Ambien to help sleep. We'll consider Ambien CR if plain zolpidem does not help because most of his difficulty sleeping is waking up early. Cost  may be an issue

## 2011-06-26 ENCOUNTER — Encounter: Payer: Self-pay | Admitting: Internal Medicine

## 2011-07-21 ENCOUNTER — Other Ambulatory Visit: Payer: Self-pay | Admitting: Internal Medicine

## 2011-07-26 ENCOUNTER — Ambulatory Visit (INDEPENDENT_AMBULATORY_CARE_PROVIDER_SITE_OTHER): Payer: 59 | Admitting: Internal Medicine

## 2011-07-26 DIAGNOSIS — F411 Generalized anxiety disorder: Secondary | ICD-10-CM

## 2011-07-26 DIAGNOSIS — Z23 Encounter for immunization: Secondary | ICD-10-CM

## 2011-07-26 DIAGNOSIS — E119 Type 2 diabetes mellitus without complications: Secondary | ICD-10-CM

## 2011-07-26 DIAGNOSIS — R209 Unspecified disturbances of skin sensation: Secondary | ICD-10-CM

## 2011-07-26 DIAGNOSIS — R202 Paresthesia of skin: Secondary | ICD-10-CM | POA: Insufficient documentation

## 2011-07-26 DIAGNOSIS — F419 Anxiety disorder, unspecified: Secondary | ICD-10-CM

## 2011-07-26 NOTE — Progress Notes (Signed)
  Subjective:    Patient ID: Joe Blanchard, male    DOB: 05-28-1961, 50 y.o.   MRN: [id number]  HPI 4 few weeks, he is experiencing a "funny feeling in my feet". They feel either hot at night or cold during the day. Denies feeling to be painful. 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 No problems walking. As far as his diabetes, he is eating healthier No back pain      Objective:   Physical Exam  Constitutional: He is oriented to person, place, and time. He appears well-developed and well-nourished.  Musculoskeletal: He exhibits no edema.       DIABETIC FEET EXAM: No lower extremity edema Normal pedal pulses bilaterally Skin and nails are normal without calluses Pinprick examination of the feet normal.   Neurological: He is alert and oriented to person, place, and time.  Psychiatric: He has a normal mood and affect. His behavior is normal. Judgment and thought content normal.      Assessment & Plan:  Today , I spent more than 25 min with the patient, >50% of the time counseling about paresthesias and DM

## 2011-07-26 NOTE — Assessment & Plan Note (Addendum)
Doing better w/  Life style A1C discussed Likes a nutritionist referral, likes to go to his wife

## 2011-07-26 NOTE — Patient Instructions (Signed)
Call if your symptoms persist Keep your follow up in few months

## 2011-07-26 NOTE — Assessment & Plan Note (Addendum)
Reports  Sx are well controlled at present, see "paresthesias"

## 2011-07-26 NOTE — Assessment & Plan Note (Signed)
Feet paresthesia Related to citalopram ? 1% of patients develop paresthesias Neuropathy? Sx are atypical, doubt neuropathy We discussed change citalopram vs observe He elected observe, will call if sx no better in few weeks

## 2011-08-19 ENCOUNTER — Ambulatory Visit (AMBULATORY_SURGERY_CENTER): Payer: 59 | Admitting: *Deleted

## 2011-08-19 ENCOUNTER — Encounter: Payer: Self-pay | Admitting: Internal Medicine

## 2011-08-19 VITALS — Ht 71.75 in | Wt 200.0 lb

## 2011-08-19 DIAGNOSIS — Z86010 Personal history of colonic polyps: Secondary | ICD-10-CM

## 2011-08-19 DIAGNOSIS — Z1211 Encounter for screening for malignant neoplasm of colon: Secondary | ICD-10-CM

## 2011-08-19 MED ORDER — PEG-KCL-NACL-NASULF-NA ASC-C 100 G PO SOLR: ORAL | Status: DC

## 2011-09-01 ENCOUNTER — Encounter: Payer: Self-pay | Admitting: Internal Medicine

## 2011-09-01 ENCOUNTER — Ambulatory Visit (AMBULATORY_SURGERY_CENTER): Payer: 59 | Admitting: Internal Medicine

## 2011-09-01 VITALS — BP 117/65 | HR 56 | Temp 97.9°F | Resp 16 | Ht 71.75 in | Wt 200.0 lb

## 2011-09-01 DIAGNOSIS — D126 Benign neoplasm of colon, unspecified: Secondary | ICD-10-CM

## 2011-09-01 DIAGNOSIS — Z860100 Personal history of colon polyps, unspecified: Secondary | ICD-10-CM

## 2011-09-01 DIAGNOSIS — Z1211 Encounter for screening for malignant neoplasm of colon: Secondary | ICD-10-CM

## 2011-09-01 MED ORDER — SODIUM CHLORIDE 0.9 % IV SOLN: 500.0000 mL | INTRAVENOUS | Status: DC

## 2011-09-01 NOTE — Patient Instructions (Signed)
Discharge instructions given with verbal understanding.  Handouts on polyps and diverticulosis given.  Resume previous medications. 

## 2011-09-01 NOTE — Op Note (Signed)
[facility] [address] [place], [place]  [address]  COLONOSCOPY PROCEDURE REPORT  PATIENT:  Joe, Blanchard  MR#:  [id number] BIRTHDATE:  1960-08-17, 50 yrs. old  GENDER:  male ENDOSCOPIST:  Wilhemina Bonito. Eda Keys, MD REF. BY:  Surveillance Program Recall, PROCEDURE DATE:  09/01/2011 PROCEDURE:  Colonoscopy with snare polypectomy x 2 ASA CLASS:  Class II INDICATIONS:  history of pre-cancerous (adenomatous) colon polyps, surveillance and high-risk screening, family history of colon cancer ; index exam 06-2010 w/ 9 adenomas; father @ 4 MEDICATIONS:   MAC sedation, administered by CRNA, propofol (Diprivan) 400 mg IV  DESCRIPTION OF PROCEDURE:   After the risks benefits and alternatives of the procedure were thoroughly explained, informed consent was obtained.  Digital rectal exam was performed and revealed no abnormalities.   The LB CF-H180AL [id number] endoscope was introduced through the anus and advanced to the cecum, which was identified by both the appendix and ileocecal valve, without limitations.  The quality of the prep was excellent, using MoviPrep.  The instrument was then slowly withdrawn as the colon was fully examined. <<PROCEDUREIMAGES>>  FINDINGS:  Two 4mm  polyps were found in the ascending colon and snared without cautery. Retrieval was successful.  Mild diverticulosis was found found scattered throught the colon. Otherwise normal colonoscopy without other polyps, masses, vascular ectasias, or inflammatory changes.   Retroflexed views in the rectum revealed no abnormalities.    The time to cecum = 1:43 minutes. The scope was then withdrawn in 12:02  minutes from the cecum and the procedure completed.  COMPLICATIONS:  None  ENDOSCOPIC IMPRESSION: 1) Two polyps in the ascending colon - recall 2) Mild diverticulosis found scattered throught the colon 3) Otherwise normal colonoscopy  RECOMMENDATIONS: 1) Repeat Colonoscopy in 3  years.  ______________________________ Wilhemina Bonito. Eda Keys, MD  CC:  Joe Ora, MD;  The Patient  n. eSIGNED:   Wilhemina Bonito. Eda Keys at 09/01/2011 09:34 AM  Joe Blanchard, Joe Blanchard, [id number]

## 2011-09-01 NOTE — Progress Notes (Signed)
Patient did not experience any of the following events: a burn prior to discharge; a fall within the facility; wrong site/side/patient/procedure/implant event; or a hospital transfer or hospital admission upon discharge from the facility. (G8907) Patient did not have preoperative order for IV antibiotic SSI prophylaxis. (G8918)  

## 2011-09-02 ENCOUNTER — Telehealth: Payer: Self-pay | Admitting: *Deleted

## 2011-09-02 NOTE — Telephone Encounter (Signed)

## 2011-09-06 ENCOUNTER — Encounter: Payer: Self-pay | Admitting: Internal Medicine

## 2011-09-26 ENCOUNTER — Telehealth: Payer: Self-pay | Admitting: Internal Medicine

## 2011-09-26 NOTE — Telephone Encounter (Signed)
thank you !!

## 2011-09-26 NOTE — Telephone Encounter (Signed)
Has an appt tomorrow morning at 9am-ss  [facility] Triage Call Report Triage Record Num: [id number] Operator: Revonda Humphrey Patient Name: Joe Blanchard Call Date & Time: 09/26/2011 11:02:05AM Patient Phone: [phone] PCP: Patient Gender: Male PCP Fax : Patient DOB: 05/24/1961 Practice Name: [facility] Reason for Call: Caller: [name]/Patient; PCP: Willow Ora; CB#: [phone]; ; ; Call regarding ?Marland Kitchen Sinus Infection.Marland Kitchen Headache for Several Weeks, Sore Throat Off and Cough.; Dull headache for weeks is worse at night, thinks may be sinus infection. Upper teeth pain, facial pain worse if bending over. Started with intermittent sore throat but that has finally stopped. Cough intermittently. Eyes feel hot in evenings for has not noted fever when temp taken. Taking Motrin, Tylenol QD for pain. No appt today, consult Lillia Abed in office - given appt 9:00am tomorrow. Protocol(s) Used: Diabetes: Respiratory Problems Recommended Outcome per Protocol: See Provider within 24 hours Reason for Outcome: Pain above or below eyes over sinuses (teeth/eyes may hurt) Mild to moderate headache for more than 24 hours unrelieved with nonprescription medications Care Advice:

## 2011-09-27 ENCOUNTER — Ambulatory Visit (INDEPENDENT_AMBULATORY_CARE_PROVIDER_SITE_OTHER): Payer: 59 | Admitting: Internal Medicine

## 2011-09-27 VITALS — BP 112/72 | HR 69 | Temp 98.5°F | Wt 199.0 lb

## 2011-09-27 DIAGNOSIS — E119 Type 2 diabetes mellitus without complications: Secondary | ICD-10-CM

## 2011-09-27 DIAGNOSIS — R519 Headache, unspecified: Secondary | ICD-10-CM | POA: Insufficient documentation

## 2011-09-27 DIAGNOSIS — R51 Headache: Secondary | ICD-10-CM

## 2011-09-27 MED ORDER — FLUTICASONE PROPIONATE 50 MCG/ACT NA SUSP: 2.0000 | Freq: Every day | NASAL | Status: DC

## 2011-09-27 MED ORDER — AMOXICILLIN-POT CLAVULANATE 500-125 MG PO TABS: 1.0000 | ORAL_TABLET | Freq: Three times a day (TID) | ORAL | Status: AC

## 2011-09-27 NOTE — Progress Notes (Signed)
  Subjective:    Patient ID: Joe Blanchard, male    DOB: 1960-10-28, 51 y.o.   MRN: [id number]  HPI Acute visit. Few weeks history of dull, daily headache, "behind eyes", has not been severe enough to stop doing any activities. has taken Tylenol and Advil with some help. He thinks he may be a sinus infection, see review of systems. Also, concerned about his recently diagnosed diabetes. He is exercising more, has lost some weight.  Past Medical History:  Mild diabetes A1C 6.6 --> 05-2011  Anxiety  Colon polyps    Past Surgical History:  left knee scope 12/03/2008  Review of Systems No fever or chills No nausea or vomiting In the last few weeks has had on and off runny nose, sore throat, cough, postnasal dripping. This morning he blew a small amount of nasal discharge with blood. Denies any head injury or diplopia. Headaches are slightly worse when he bends his torso over. He only takes one or 2 servings of caffeine daily. Mild neck pain without radiation. Some stress but citalopram is helping.    Objective:   Physical Exam  Constitutional: He is oriented to person, place, and time. He appears well-developed and well-nourished. No distress.  HENT:       Ears normal, throat without redness, nose slightly congested. Asymmetric and not tender to palpation    Eyes: EOM are normal. Pupils are equal, round, and reactive to light.  Neck: Normal range of motion. Neck supple.  Cardiovascular: Normal rate, regular rhythm and normal heart sounds.   No murmur heard. Pulmonary/Chest: Effort normal and breath sounds normal. No respiratory distress. He has no wheezes. He has no rales.  Musculoskeletal: He exhibits no edema.  Neurological: He is alert and oriented to person, place, and time.       Face symmetric, gait, motor and speech normal.  Skin: He is not diaphoretic.       Assessment & Plan:

## 2011-09-27 NOTE — Assessment & Plan Note (Signed)
Mild persistent HA associated w/ on-off respiratory sx , normal neuro exam DDX tensional, ethmoidal sinusitits, cervicogenic, others  Plan: treat sinusitis, if no better pt will call for further eval

## 2011-09-27 NOTE — Patient Instructions (Addendum)
As needed tylenol take Mucinex  twice a day x 1 week flonase x 1 month Take the antibiotic as prescribed ----> Augmentin Call if no better in 2-3 weeks  Call anytime if the symptoms are severe

## 2011-09-27 NOTE — Assessment & Plan Note (Signed)
a1c discussed Has not seen the dietician yet  Exercising more, has lost wt

## 2011-09-28 ENCOUNTER — Encounter: Payer: Self-pay | Admitting: Internal Medicine

## 2011-10-01 ENCOUNTER — Encounter: Payer: Self-pay | Admitting: Internal Medicine

## 2011-10-19 ENCOUNTER — Encounter: Payer: 59 | Attending: Internal Medicine | Admitting: *Deleted

## 2011-10-19 ENCOUNTER — Encounter: Payer: Self-pay | Admitting: *Deleted

## 2011-10-19 DIAGNOSIS — E119 Type 2 diabetes mellitus without complications: Secondary | ICD-10-CM | POA: Insufficient documentation

## 2011-10-19 DIAGNOSIS — Z713 Dietary counseling and surveillance: Secondary | ICD-10-CM | POA: Insufficient documentation

## 2011-10-19 NOTE — Progress Notes (Signed)
  Medical Nutrition Therapy:  Appt start time: 0830 end time:  0930.  Assessment:  Primary concerns today: patient here for diabetes education. 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 Family eats out often, he is active with his son doing core exercises every AM together. He states he recently has lost 10 pounds and his A1c has dropped from 6.6 to 5.8% in past 3 months also.   MEDICATIONS: see list   DIETARY INTAKE:  Usual eating pattern includes 3 meals and 2-3 snacks per day.  Everyday foods include good variety of all food groups.  Avoided foods include high carb foods lately due to new diagnosis of diabetes.    24-hr recall:  B ( AM):  Sweetened cereal, skim milk, fruit during the week OR eggs, bacon, toast, jelly, coffee, black Snk ( AM): apple or other fruit,   L ( PM): salad from home OR 6" sub OR BBQ sandwich and fries, diet soda or water  Snk ( PM): not usually, but gets very hungry D ( PM):eat out 4-5 times a week - cafeteria - vegetables, lean meat, no potatoes lately, likes beans Snk ( PM): leftovers, ice cream, (grazing) Beverages: coffee, water, sweet tea, diet soda  Usual physical activity: used to walk 40 minutes every morning, not now due to knee surgery (now does core exercises every morning 20 minutes)  Estimated energy needs: 1800 calories 200 g carbohydrates 135 g protein 50 g fat  Progress Towards Goal(s):  In progress.   Nutritional Diagnosis:  NB-1.1 Food and nutrition-related knowledge deficit As related to new diagnosis of diabetes.  As evidenced by excessi avoidance of high carb foods.   Intervention:  Nutrition counseling on basic physiology of diabetes, rationale of consistent carb intake, carb counting and reading food labels. Plan: Aim for 3-4 Carb choices (45 - 60 grams) / meal, 0-2 per snack if hungry Continue with daily core exercises every morning with son Read Food Labels for Total Carbohydrate of foods at  home   Handouts given during visit include:  Living Well with Diabetes  Carb Counting and Food Label handouts  Monitoring/Evaluation:  Dietary intake, exercise, read food labels, and body weight prn.

## 2011-10-19 NOTE — Patient Instructions (Addendum)
Plan: Aim for 3-4 Carb choices (45 - 60 grams) / meal, 0-2 per snack if hungry Continue with daily core exercises every morning with son Read Food Labels for Total Carbohydrate of foods at home

## 2011-10-22 ENCOUNTER — Other Ambulatory Visit: Payer: Self-pay | Admitting: Internal Medicine

## 2011-10-24 NOTE — Telephone Encounter (Signed)
Prescription sent to pharmacy.

## 2011-10-26 ENCOUNTER — Ambulatory Visit: Payer: 59 | Admitting: Internal Medicine

## 2011-10-28 ENCOUNTER — Telehealth: Payer: Self-pay | Admitting: *Deleted

## 2011-10-28 NOTE — Telephone Encounter (Signed)
Sounds like a thrombosed hemorrhoids, rec to go to the ER

## 2011-10-28 NOTE — Telephone Encounter (Signed)
[facility] Triage Call Report Triage Record Num: [id number] Operator: Chevis Pretty Patient Name: Eissa Buchberger Call Date & Time: 10/28/2011 10:22:52AM Patient Phone: [phone] PCP: Patient Gender: Male PCP Fax : Patient DOB: [birth year] Practice Name: [facility] Reason for Call: Caller: Rockie/Patient; PCP: Willow Ora; CB#: [phone]; ; ; Call regarding Hemorrhoids; onset ~ 2 weeks ago. Used wife's Proctasol but only has marginal relief with that. States is uncomfortable. States had colonscopy in January and was all clear and had no visible hemorrhoids at that time. States there is a palpable swelling "hanging down outside the rectum." Per protocol, emergnet symptoms denied; advised appt wihtin 72 hours. Unable to come to office 10/28/11 due to travel, but can come early 10/31/11 or 11/01/11. Appt sched in office 11/01/11 0900 with Dr. Drue Novel. Protocol(s) Used: Rectal Symptoms Recommended Outcome per Protocol: See Provider within 72 Hours Reason for Outcome: Pain in rectum or rectal area AND hemorrhoids that have not been evaluated or self care is not working Care Advice: ~ Call provider if symptoms worsen or new symptoms develop. ~ SYMPTOM / CONDITION MANAGEMENT ~ CAUTIONS ~ Avoid tight underclothing. Cotton underwear and loose clothing will help increase comfort. Hemorrhoid Comfort Measures: - Take warm sitz baths 2-3 times a day to decrease discomfort. - Consider nonprescription anti-inflammatory topical treatments (TUCKS, witch hazel, Preparation H, Nupercainal or Anusal HC ointment per label, pharmacist or provider recommendations). - Gently clean rectal area after bowel movement; avoid harsh cleansing soap or medications. - Use TUCKS, moist towelettes, or toilet tissue moistened with witch hazel to clean after bowel movement. - May apply cloth-covered ice pack to anal area for 10-15 minutes to relieve swelling. - Avoid prolonged sitting. Use donut  cushion as needed for sitting (available in most drug stores). - Avoid heavy lifting. - Call provider if having rectal bleeding; swollen, hard hemorrhoids, or severe rectal pain not responding to home care. ~ Call provider immediately if having constant, severe or worsening abdominal pain, cramping or rectal pain, vomiting, new more than scant rectal bleeding or bloody or black tarry stools or very thin, pencil-like stools. Do not take any laxatives until talking with provider. ~ 10/28/2011 10:33:33AM

## 2011-10-28 NOTE — Telephone Encounter (Signed)
LMOVM for pt to go to ER not urgent care.

## 2011-11-01 ENCOUNTER — Encounter: Payer: Self-pay | Admitting: Internal Medicine

## 2011-11-01 ENCOUNTER — Ambulatory Visit (INDEPENDENT_AMBULATORY_CARE_PROVIDER_SITE_OTHER): Payer: 59 | Admitting: Internal Medicine

## 2011-11-01 VITALS — BP 132/78 | HR 65 | Temp 98.8°F | Wt 201.0 lb

## 2011-11-01 DIAGNOSIS — K649 Unspecified hemorrhoids: Secondary | ICD-10-CM | POA: Insufficient documentation

## 2011-11-01 MED ORDER — HYDROCORTISONE 2.5 % EX OINT: TOPICAL_OINTMENT | Freq: Two times a day (BID) | CUTANEOUS | Status: DC

## 2011-11-01 NOTE — Patient Instructions (Signed)
Hemorrhoids Hemorrhoids are enlarged (dilated) veins around the rectum. There are 2 types of hemorrhoids, and the type of hemorrhoid is determined by its location. Internal hemorrhoids occur in the veins just inside the rectum.They are usually not painful, but they may bleed.However, they may poke through to the outside and become irritated and painful. External hemorrhoids involve the veins outside the anus and can be felt as a painful swelling or hard lump near the anus.They are often itchy and may crack and bleed. Sometimes clots will form in the veins. This makes them swollen and painful. These are called thrombosed hemorrhoids. CAUSES Causes of hemorrhoids include:  Pregnancy. This increases the pressure in the hemorrhoidal veins.   Constipation.   Straining to have a bowel movement.   Obesity.   Heavy lifting or other activity that caused you to strain.  TREATMENT Most of the time hemorrhoids improve in 1 to 2 weeks. However, if symptoms do not seem to be getting better or if you have a lot of rectal bleeding, your caregiver may perform a procedure to help make the hemorrhoids get smaller or remove them completely.Possible treatments include:  Rubber band ligation. A rubber band is placed at the base of the hemorrhoid to cut off the circulation.   Sclerotherapy. A chemical is injected to shrink the hemorrhoid.   Infrared light therapy. Tools are used to burn the hemorrhoid.   Hemorrhoidectomy. This is surgical removal of the hemorrhoid.  HOME CARE INSTRUCTIONS   Increase fiber in your diet. Ask your caregiver about using fiber supplements.   Drink enough water and fluids to keep your urine clear or pale yellow.   Exercise regularly.   Go to the bathroom when you have the urge to have a bowel movement. Do not wait.   Avoid straining to have bowel movements.   Keep the anal area dry and clean.   Only take over-the-counter or prescription medicines for pain,  discomfort, or fever as directed by your caregiver.  If your hemorrhoids are thrombosed:  Take warm sitz baths for 20 to 30 minutes, 3 to 4 times per day.   If the hemorrhoids are very tender and swollen, place ice packs on the area as tolerated. Using ice packs between sitz baths may be helpful. Fill a plastic bag with ice. Place a towel between the bag of ice and your skin.   Medicated creams and suppositories may be used or applied as directed.   Do not use a donut-shaped pillow or sit on the toilet for long periods. This increases blood pooling and pain.  SEEK MEDICAL CARE IF:   You have increasing pain and swelling that is not controlled with your medicine.   You have uncontrolled bleeding.   You have difficulty or you are unable to have a bowel movement.   You have pain or inflammation outside the area of the hemorrhoids.   You have chills or an oral temperature above 102 F (38.9 C).  MAKE SURE YOU:   Understand these instructions.   Will watch your condition.   Will get help right away if you are not doing well or get worse.  Document Released: 07/29/2000 Document Revised: 07/21/2011 Document Reviewed: 12/04/2007 [facility] Patient Information 2012 [place], [place].    Metamucil 2 caps daily with lots of fluids Colace OTC if needed for hard stools Mix nupercainal (OTC) and hydrocortisone 2.5 % 50-50 and apply to the hemorrhoid twice a day as needed  Call if pain, bleeding, swelling

## 2011-11-01 NOTE — Progress Notes (Signed)
  Subjective:    Patient ID: Joe Blanchard, male    DOB: 10/20/1960, 51 y.o.   MRN: [id number]  HPI Acute visit,  noted some anal discomfort 2 weeks ago, worse for the last 2 days. Discomfort is worse when he is sitting. He also noted a "knot" externally a few days ago.  Past Medical History:  Mild diabetes A1C 6.6 --> 05-2011  Anxiety  Colon polyps  Past Surgical History:  left knee scope 12/03/2008   Review of Systems No nausea, vomiting, constipation or abdominal pain. No blood in the stools.     Objective:   Physical Exam  Alert, oriented x3, no apparent distress. Abdomen is soft, nontender. Digital rectal exam: Externally he has a  1 cm, soft, nontender hemorrhoid at 2:00 oclock. The rest of exam is normal.      Assessment & Plan:   Hemorrhoid, see  Instructions Had a recent Cscope  Also was seen recently with a HA, he is feeling better, headache essentially resolved

## 2011-11-17 ENCOUNTER — Telehealth: Payer: Self-pay | Admitting: Internal Medicine

## 2011-11-17 MED ORDER — ZOLPIDEM TARTRATE 10 MG PO TABS: 10.0000 mg | ORAL_TABLET | Freq: Every evening | ORAL | Status: DC | PRN

## 2011-11-17 NOTE — Telephone Encounter (Signed)
OK for 30 only

## 2011-11-17 NOTE — Telephone Encounter (Signed)
Refill: Zolpidem tartrate 10 mg tablet. Take 1 tablet by mouth as needed for sleep.

## 2011-11-17 NOTE — Telephone Encounter (Signed)
#  30 with 3 refills. Last refilled on 11.9.2012. OK to refill?

## 2011-11-17 NOTE — Telephone Encounter (Signed)
Refill done.  

## 2011-12-21 ENCOUNTER — Telehealth: Payer: Self-pay | Admitting: Internal Medicine

## 2011-12-21 MED ORDER — ZOLPIDEM TARTRATE 10 MG PO TABS: 10.0000 mg | ORAL_TABLET | Freq: Every evening | ORAL | Status: DC | PRN

## 2011-12-21 NOTE — Telephone Encounter (Signed)
#

## 2011-12-21 NOTE — Telephone Encounter (Signed)
Refill: Zolpidem tartrate 10mg tablet. Take 1 tablet by mouth at bedtime as needed for sleep.  

## 2011-12-21 NOTE — Telephone Encounter (Signed)
Refill done.  

## 2011-12-21 NOTE — Telephone Encounter (Signed)
Ok to refill 

## 2011-12-28 ENCOUNTER — Ambulatory Visit: Payer: 59 | Admitting: Internal Medicine

## 2012-01-04 ENCOUNTER — Ambulatory Visit (INDEPENDENT_AMBULATORY_CARE_PROVIDER_SITE_OTHER): Payer: 59 | Admitting: Internal Medicine

## 2012-01-04 DIAGNOSIS — E785 Hyperlipidemia, unspecified: Secondary | ICD-10-CM

## 2012-01-04 DIAGNOSIS — E119 Type 2 diabetes mellitus without complications: Secondary | ICD-10-CM

## 2012-01-04 NOTE — Assessment & Plan Note (Addendum)
Here for a diabetes checkup, he is doing better w/  lifestyle. We discussed diet and exercise, the glycemic index, the need to eat less carbohydrates and more proteins. He could possibly benefit from the [employer]. He already saw a dietitian. Encouraged to continue with exercise.

## 2012-01-04 NOTE — Assessment & Plan Note (Signed)
Planning to check his cholesterol when he comes back in few months

## 2012-01-04 NOTE — Progress Notes (Signed)
  Subjective:    Patient ID: Joe Blanchard, male    DOB: May 02, 1961, 51 y.o.   MRN: [id number]  HPI Routine visit As far as his diabetes, his diet is still healthy, he is active, on average 3 hours a week.  Past Medical History:   Mild diabetes A1C 6.6 --> 05-2011   Anxiety   Colon polyps    Past Surgical History:   left knee scope 12/03/2008    Review of Systems Was seen w/  hemorrhoids, symptoms self subsided.    Objective:   Physical Exam  General -- alert, well-developed, and well-nourished.  Psych-- Cognition and judgment appear intact. Alert and cooperative with normal attention span and concentration.  not anxious appearing and not depressed appearing.        Assessment & Plan:

## 2012-01-04 NOTE — Patient Instructions (Signed)
recommend the book  [employer]

## 2012-01-05 ENCOUNTER — Encounter: Payer: Self-pay | Admitting: Internal Medicine

## 2012-01-05 ENCOUNTER — Encounter: Payer: Self-pay | Admitting: *Deleted

## 2012-02-23 ENCOUNTER — Other Ambulatory Visit: Payer: Self-pay | Admitting: Internal Medicine

## 2012-02-23 MED ORDER — ZOLPIDEM TARTRATE 10 MG PO TABS: 10.0000 mg | ORAL_TABLET | Freq: Every evening | ORAL | Status: DC | PRN

## 2012-02-23 NOTE — Telephone Encounter (Signed)
Ok 90, 0 RF

## 2012-02-23 NOTE — Telephone Encounter (Signed)
Ok to refill 

## 2012-02-23 NOTE — Telephone Encounter (Signed)
Refill Zolpidem Tartrate (Tab) AMBIEN 10 MG Take 1 tablet (10 mg total) by mouth at bedtime as needed for sleep.  Last ov 5.22.13  No last fill date, no last wrt date on meds list

## 2012-02-23 NOTE — Telephone Encounter (Signed)
Refill done,  

## 2012-05-26 ENCOUNTER — Other Ambulatory Visit: Payer: Self-pay | Admitting: Internal Medicine

## 2012-05-28 NOTE — Telephone Encounter (Signed)
Ok to refill 

## 2012-05-28 NOTE — Telephone Encounter (Signed)
done

## 2012-07-17 ENCOUNTER — Encounter: Payer: Self-pay | Admitting: Internal Medicine

## 2012-07-17 ENCOUNTER — Ambulatory Visit (INDEPENDENT_AMBULATORY_CARE_PROVIDER_SITE_OTHER): Payer: 59 | Admitting: Internal Medicine

## 2012-07-17 VITALS — BP 126/84 | HR 63 | Temp 97.9°F | Wt 205.0 lb

## 2012-07-17 DIAGNOSIS — R209 Unspecified disturbances of skin sensation: Secondary | ICD-10-CM

## 2012-07-17 DIAGNOSIS — F419 Anxiety disorder, unspecified: Secondary | ICD-10-CM

## 2012-07-17 DIAGNOSIS — F411 Generalized anxiety disorder: Secondary | ICD-10-CM

## 2012-07-17 DIAGNOSIS — R202 Paresthesia of skin: Secondary | ICD-10-CM

## 2012-07-17 DIAGNOSIS — E119 Type 2 diabetes mellitus without complications: Secondary | ICD-10-CM

## 2012-07-17 NOTE — Assessment & Plan Note (Addendum)
We again  discussed paresthesias, currently RLS seems more likely than neuropathy or a citalopram side effect. Plan: Observation for now + check B12 and folic acid; neurontin? Not interested on medication at this time

## 2012-07-17 NOTE — Assessment & Plan Note (Addendum)
Currently well controlled. Concerned about the long-term use of SSRI, I don't think that is an issue for him at this point, we agreed to reassess the need SSRI to every 6 months, for now he likes to continue on it. Still takes Ambien at night, despite the fact that he sleeps only 4-6 hours he feels rested.

## 2012-07-17 NOTE — Progress Notes (Signed)
  Subjective:    Patient ID: Joe Blanchard, male    DOB: Jun 21, 1961, 51 y.o.   MRN: [id number]  HPI Routine visit Insomnia, takes Ambien every night, he is concerned because he is only sleeping about 4-6 hours at night however he denies feeling tired, sleepy, energy level is normal. Snores only rarely. Diabetes, on diet and exercise, lifestyle continued to be satisfactory. Anxiety, well-controlled, again he is concerned about long-term use of citalopram, see assessment and plan, he is doing great as far as sx control Continue with paresthesias of his feet, mostly at night or when he's driving, can't describe well but feels "weird", numbness. No "burning" per se. Sx decrease  w/ moving his feet.  Past Medical History  Diagnosis Date  . Anxiety   . Colon polyps     Cscope 11-11, next 06-2011  . Diabetes mellitus dx 10-12   Past Surgical History  Procedure Date  . Knee surgery 12/03/08    scope   Review of Systems No nausea, vomiting or diarrhea No chest pain or shortness of breath     Objective:   Physical Exam General -- alert, well-developed, and well-nourished.   DIABETIC FEET EXAM: No lower extremity edema Normal pedal pulses bilaterally Skin and nails are normal without calluses Pinprick examination of the feet normal. Neurologic-- alert & oriented X3 and strength normal in all extremities. Psych-- Cognition and judgment appear intact. Alert and cooperative with normal attention span and concentration.  not anxious appearing and not depressed appearing.        Assessment & Plan:  Today , I spent more than 25 min with the patient, >50% of the time counseling about SSRIs, also paresthesias, see assessment and plan

## 2012-07-17 NOTE — Assessment & Plan Note (Addendum)
On diet control only, reports recent (-) eye exam negative  plan: Labs and continue with diet and exercise

## 2012-07-17 NOTE — Patient Instructions (Addendum)
Please get a flu shot at your pharmacy  Please come back fasting: BMP, A1c, microalbumin, FLP  ---- dx diabetes B12, folic acid ---- dx paresthesias  Schedule a physical exam 4 months from now

## 2012-07-18 ENCOUNTER — Ambulatory Visit: Payer: 59

## 2012-07-18 ENCOUNTER — Other Ambulatory Visit (INDEPENDENT_AMBULATORY_CARE_PROVIDER_SITE_OTHER): Payer: 59 | Admitting: *Deleted

## 2012-07-18 ENCOUNTER — Other Ambulatory Visit (INDEPENDENT_AMBULATORY_CARE_PROVIDER_SITE_OTHER): Payer: 59

## 2012-07-18 DIAGNOSIS — R209 Unspecified disturbances of skin sensation: Secondary | ICD-10-CM

## 2012-07-18 DIAGNOSIS — E119 Type 2 diabetes mellitus without complications: Secondary | ICD-10-CM

## 2012-07-18 DIAGNOSIS — Z23 Encounter for immunization: Secondary | ICD-10-CM

## 2012-07-18 DIAGNOSIS — Z0000 Encounter for general adult medical examination without abnormal findings: Secondary | ICD-10-CM

## 2012-07-18 DIAGNOSIS — R202 Paresthesia of skin: Secondary | ICD-10-CM

## 2012-07-18 LAB — BASIC METABOLIC PANEL
BUN: 17 mg/dL (ref 6–23)
Chloride: 102 mEq/L (ref 96–112)
Potassium: 4 mEq/L (ref 3.5–5.1)

## 2012-07-18 LAB — MICROALBUMIN / CREATININE URINE RATIO
Creatinine,U: 277.6 mg/dL
Microalb Creat Ratio: 0.7 mg/g (ref 0.0–30.0)
Microalb, Ur: 1.9 mg/dL (ref 0.0–1.9)

## 2012-07-18 LAB — HEMOGLOBIN A1C: Hgb A1c MFr Bld: 6.1 % (ref 4.6–6.5)

## 2012-07-18 LAB — FOLATE: Folate: >24.8 ng/mL (ref >5.9)

## 2012-07-18 LAB — LIPID PANEL: Cholesterol: 242 mg/dL — ABNORMAL HIGH (ref 0–200)

## 2012-08-29 ENCOUNTER — Other Ambulatory Visit: Payer: Self-pay | Admitting: Internal Medicine

## 2012-08-30 ENCOUNTER — Encounter: Payer: Self-pay | Admitting: *Deleted

## 2012-08-30 NOTE — Telephone Encounter (Signed)
done

## 2012-08-30 NOTE — Telephone Encounter (Signed)
Pt made aware rx & controlled substance contract is at front desk ready to be picked up.  

## 2012-08-30 NOTE — Telephone Encounter (Signed)
OK to refill? Last OV 12.3.13 Last filled 10.12.13

## 2012-09-18 ENCOUNTER — Encounter: Payer: Self-pay | Admitting: Lab

## 2012-09-19 ENCOUNTER — Encounter: Payer: Self-pay | Admitting: Internal Medicine

## 2012-09-19 ENCOUNTER — Ambulatory Visit (INDEPENDENT_AMBULATORY_CARE_PROVIDER_SITE_OTHER): Payer: 59 | Admitting: Internal Medicine

## 2012-09-19 VITALS — BP 112/78 | HR 64 | Temp 98.2°F | Ht 71.0 in | Wt 206.0 lb

## 2012-09-19 DIAGNOSIS — Z0000 Encounter for general adult medical examination without abnormal findings: Secondary | ICD-10-CM

## 2012-09-19 DIAGNOSIS — E119 Type 2 diabetes mellitus without complications: Secondary | ICD-10-CM

## 2012-09-19 DIAGNOSIS — F419 Anxiety disorder, unspecified: Secondary | ICD-10-CM

## 2012-09-19 LAB — CBC WITH DIFFERENTIAL/PLATELET
Basophils Relative: 0.3 % (ref 0.0–3.0)
Eosinophils Absolute: 0.1 10*3/uL (ref 0.0–0.7)
Hemoglobin: 15.2 g/dL (ref 13.0–17.0)
Lymphocytes Relative: 18.6 % (ref 12.0–46.0)
MCHC: 34.3 g/dL (ref 30.0–36.0)
Neutro Abs: 5 10*3/uL (ref 1.4–7.7)
RBC: 5.22 Mil/uL (ref 4.22–5.81)

## 2012-09-19 MED ORDER — FLUOXETINE HCL 20 MG PO TABS: 30.0000 mg | ORAL_TABLET | Freq: Every day | ORAL | Status: DC

## 2012-09-19 NOTE — Progress Notes (Signed)
  Subjective:    Patient ID: Joe Blanchard, male    DOB: [birth year], 52 y.o.   MRN: [id number]  HPI CPX  Past Medical History  Diagnosis Date  . Anxiety   . Colon polyps     Cscope 11-11, next 06-2011  . Diabetes mellitus dx 10-12   Past Surgical History  Procedure Date  . Knee surgery 12/03/08    scope   History   Social History  . Marital Status: Married    Spouse Name: N/A    Number of Children: N/A  . Years of Education: N/A   Occupational History  . car appraisal     Social History Main Topics  . Smoking status: Never Smoker   . Smokeless tobacco: Never Used  . Alcohol Use: 1.8 oz/week    3 Glasses of wine per week     Comment: socially   . Drug Use: No  . Sexually Active: Yes -- Male partner(s)   Other Topics Concern  . Not on file   Social History Narrative   Married, 2 children ---Exercise, some, not as much as before d/t knee pain ---Diet,    Family History  Problem Relation Age of Onset  . Colon cancer Father     dx in his 66s  . Lupus Sister     anticoagulant  . Heart disease Other   . Colon polyps Other   . Prostate cancer Father     dx in his 78s?  . CAD      GF  . Diabetes Neg Hx    Review of Systems Reports some fatigue, mostly in AM, feels better as the day goes by. He wonders if that may be related to some depression . Admits to some snoring particularly in the last year. He could easily fall asleep when watching TV at night but no other incidents. Not exercising much, has noted decrease in muscle mass in his legs. Denies problems with ED or decreased libido (except some decreased related to SSRIs). Denies chest pain or shortness of breath. No nausea, vomiting, diarrhea. No dysuria or gross hematuria    Objective:   Physical Exam  General -- alert, well-developed, BMI 28.   Neck --no thyromegaly  lungs -- normal respiratory effort, no intercostal retractions, no accessory muscle use, and normal breath sounds.   Heart--  normal rate, regular rhythm, no murmur, and no gallop.   Abdomen--soft, non-tender, no distention, no masses, no HSM, no guarding, and no rigidity.   Extremities-- no pretibial edema bilaterally Rectal-- No external abnormalities noted. Normal sphincter tone. No rectal masses or tenderness. Brown stool  Prostate:  Prostate gland firm and smooth, no enlargement, nodularity, tenderness, mass, asymmetry or induration. Neurologic-- alert & oriented X3 and strength normal in all extremities. Psych-- Cognition and judgment appear intact. Alert and cooperative with normal attention span and concentration.  not anxious appearing and not depressed appearing.       Assessment & Plan:

## 2012-09-19 NOTE — Assessment & Plan Note (Signed)
Last A1c satisfactory but  slightly higher than before. Plan is to improve lifestyle.

## 2012-09-19 NOTE — Patient Instructions (Addendum)
Start fluoxetine 20 mg  one tablet once a day for 2 weeks, then increase to 1.5 tablets daily. Please come back in 6 weeks fasting.

## 2012-09-19 NOTE — Assessment & Plan Note (Addendum)
Reports some fatigue in the mornings, decreased muscle mass, depression? His  PHQ 9 score is  11 (moderate depression). DDX for his sx includes OSA, hypogonadism, depression Plan: Will check a testosterone level  Switch SSRIs to prozac List of counselors provided

## 2012-09-19 NOTE — Assessment & Plan Note (Addendum)
Td 2012 + family history of prostate cancer -->  normal DRE today, check a PSA + FH colon cancer, Cscope 11-11 and 08-2011  Diet & exercise discussed Labs  C/o fatigue, see anxiety

## 2012-09-20 LAB — TESTOSTERONE, FREE, TOTAL, SHBG
Testosterone, Free: 42.4 pg/mL — ABNORMAL LOW (ref 47.0–244.0)
Testosterone-% Free: 2.3 % (ref 1.6–2.9)

## 2012-09-27 ENCOUNTER — Encounter: Payer: 59 | Admitting: Internal Medicine

## 2012-10-03 ENCOUNTER — Encounter: Payer: Self-pay | Admitting: *Deleted

## 2012-10-11 ENCOUNTER — Encounter: Payer: Self-pay | Admitting: Internal Medicine

## 2012-10-17 ENCOUNTER — Other Ambulatory Visit (INDEPENDENT_AMBULATORY_CARE_PROVIDER_SITE_OTHER): Payer: 59

## 2012-10-17 DIAGNOSIS — E291 Testicular hypofunction: Secondary | ICD-10-CM

## 2012-10-17 LAB — LUTEINIZING HORMONE: LH: 1.81 m[IU]/mL (ref 1.50–9.30)

## 2012-10-19 ENCOUNTER — Other Ambulatory Visit: Payer: Self-pay | Admitting: Internal Medicine

## 2012-10-19 DIAGNOSIS — E291 Testicular hypofunction: Secondary | ICD-10-CM

## 2012-10-31 ENCOUNTER — Ambulatory Visit: Payer: 59 | Admitting: Endocrinology

## 2012-11-01 ENCOUNTER — Ambulatory Visit (INDEPENDENT_AMBULATORY_CARE_PROVIDER_SITE_OTHER): Payer: 59 | Admitting: Internal Medicine

## 2012-11-01 VITALS — BP 118/82 | HR 62 | Wt 205.0 lb

## 2012-11-01 DIAGNOSIS — F419 Anxiety disorder, unspecified: Secondary | ICD-10-CM

## 2012-11-01 DIAGNOSIS — F411 Generalized anxiety disorder: Secondary | ICD-10-CM

## 2012-11-01 DIAGNOSIS — E291 Testicular hypofunction: Secondary | ICD-10-CM

## 2012-11-01 NOTE — Assessment & Plan Note (Addendum)
Weight change his SSRI to Prozac, and good compliance, no apparent side effects, he feels about the same, no improvment. He continue with insomnia. Also since the last time he was here she was diagnosed with hypogonadism. Plan:  Continue Prozac Reassess after hypogonadism is treated, I think that is contributing factor to his overall sense of not feeling well. Also, consider screening for sleep apnea Good sleeping habits discussed

## 2012-11-01 NOTE — Assessment & Plan Note (Signed)
Low testosterone with normal FSH, LH. To see endocrinology soon for further eval and treatment.

## 2012-11-01 NOTE — Progress Notes (Signed)
  Subjective:    Patient ID: Joe Blanchard, male    DOB: June 12, 1961, 52 y.o.   MRN: [id number]  HPI Followup Anxiety, depression. Switched  to Prozac, feeling about the same. Still feeling quite uptight early in the morning, "I feel better once I go to work and start working and get stuff done". He is extremely busy and sometimes thinks is "too much". No issues with concentration.  Past Medical History  Diagnosis Date  . Anxiety   . Colon polyps     Cscope 11-11, next 06-2011  . Diabetes mellitus dx 10-12   Past Surgical History  Procedure Laterality Date  . Knee surgery  12/03/08    scope   History   Social History  . Marital Status: Married    Spouse Name: N/A    Number of Children: N/A  . Years of Education: N/A   Occupational History  . car appraisal     Social History Main Topics  . Smoking status: Never Smoker   . Smokeless tobacco: Never Used  . Alcohol Use: 1.8 oz/week    3 Glasses of wine per week     Comment: socially   . Drug Use: No  . Sexually Active: Yes -- Male partner(s)   Other Topics Concern  . Not on file   Social History Narrative   Married, 2 children ---   Exercise, some, not as much as before d/t knee pain ---   Diet,    Review of Systems Again reports occasional snoring, he could fall asleep after a full day of work but otherwise no inappropriate falling asleep.     Objective:   Physical Exam  General -- alert, well-developed  Neurologic-- alert & oriented X3 and strength normal in all extremities. Psych-- Cognition and judgment appear intact. Alert and cooperative with normal attention span and concentration.  not anxious appearing and not depressed appearing.         Assessment & Plan:

## 2012-11-02 ENCOUNTER — Ambulatory Visit (INDEPENDENT_AMBULATORY_CARE_PROVIDER_SITE_OTHER): Payer: 59 | Admitting: Endocrinology

## 2012-11-02 ENCOUNTER — Encounter: Payer: Self-pay | Admitting: Endocrinology

## 2012-11-02 ENCOUNTER — Encounter: Payer: Self-pay | Admitting: Internal Medicine

## 2012-11-02 VITALS — BP 120/70 | HR 73 | Wt 208.0 lb

## 2012-11-02 DIAGNOSIS — E291 Testicular hypofunction: Secondary | ICD-10-CM

## 2012-11-02 MED ORDER — CLOMIPHENE CITRATE 50 MG PO TABS: ORAL_TABLET | ORAL | Status: DC

## 2012-11-02 NOTE — Progress Notes (Signed)
Subjective:    Patient ID: Joe Blanchard, male    DOB: [birth year], 52 y.o.   MRN: [id number]  HPI Pt says he had puberty at the normal age.  He has 2 biological children.  He has no h/o infertility.  He has 2 years of slight weakness of the legs, and assoc anxiety.   Past Medical History  Diagnosis Date  . Anxiety   . Colon polyps     Cscope 11-11, next 06-2011  . Diabetes mellitus dx 10-12    Past Surgical History  Procedure Laterality Date  . Knee surgery  12/03/08    scope    History   Social History  . Marital Status: Married    Spouse Name: N/A    Number of Children: N/A  . Years of Education: N/A   Occupational History  . car appraisal     Social History Main Topics  . Smoking status: Never Smoker   . Smokeless tobacco: Never Used  . Alcohol Use: 1.8 oz/week    3 Glasses of wine per week     Comment: socially   . Drug Use: No  . Sexually Active: Yes -- Male partner(s)   Other Topics Concern  . Not on file   Social History Narrative   Married, 2 children ---   Exercise, some, not as much as before d/t knee pain ---   Diet,     Current Outpatient Prescriptions on File Prior to Visit  Medication Sig Dispense Refill  . acetaminophen (TYLENOL) 500 MG tablet Take 500 mg by mouth every 6 (six) hours as needed.      . [facility] IBUPROFEN PO Take by mouth as needed.        [facility] FLUoxetine (PROZAC) 20 MG tablet Take 1.5 tablets (30 mg total) by mouth daily.  60 tablet  1  . zolpidem (AMBIEN) 10 MG tablet TAKE 1 TABLET BY MOUTH AT BEDTIME AS NEEDED  90 tablet  1   No current facility-administered medications on file prior to visit.    No Known Allergies  Family History  Problem Relation Age of Onset  . Colon cancer Father     dx in his 41s  . Lupus Sister     anticoagulant  . Heart disease Other   . Colon polyps Other   . Prostate cancer Father     dx in his 32s?  . CAD      GF  . Diabetes Neg Hx   neg for hypogonadism  BP 120/70  Pulse 73  Wt 208  lb (94.348 kg)  BMI 29.02 kg/m2  SpO2 97%    Review of Systems denies polyuria, depression, numbness, weight change, decreased urinary stream, gynecomastia, myalgias, easy bruising, sob, rash, blurry vision, rhinorrhea, chest pain.  He has mild ED sxs, headache, and cold intolerance.      Objective:   Physical Exam VS: see vs page GEN: no distress HEAD: head: no deformity eyes: no periorbital swelling, no proptosis external nose and ears are normal mouth: no lesion seen NECK: supple, thyroid is not enlarged CHEST WALL: no deformity LUNGS: clear to auscultation BREASTS:  No gynecomastia CV: reg rate and rhythm, no murmur ABD: abdomen is soft, nontender.  no hepatosplenomegaly.  not distended.  no hernia GENITALIA:  Normal male.   MUSCULOSKELETAL: muscle bulk and strength are grossly normal.  no obvious joint swelling.  gait is normal and steady EXTEMITIES: no deformity.  no edema PULSES: dorsalis pedis intact bilat.  no  carotid bruit NEURO:  cn 2-12 grossly intact.   readily moves all 4's.  sensation is intact to touch all 4's SKIN:  Normal texture and temperature.  No rash or suspicious lesion is visible.  Normal hair distribution. NODES:  None palpable at the neck PSYCH: alert, oriented x3.  Does not appear anxious nor depressed.   Lab Results  Component Value Date   TESTOSTERONE 181* 09/19/2012      Assessment & Plan:  Pituitary insufficiency, uncertain etiology Hypogonadism, of central origin, uncertain etiology Leg weakness, mild, uncertain if testosterone-related

## 2012-11-02 NOTE — Patient Instructions (Signed)
i have sent a prescription to your pharmacy. normalization of testosterone is not known to harm you.  however, there are "theoretical" risks, including increased fertility, hair loss, prostate cancer, benign prostate enlargement, blood clots, liver problems, lower hdl ("good cholesterol"), sleep apnea, and behavior changes. Please redo the blood test in 1 month.

## 2012-11-16 ENCOUNTER — Encounter: Payer: 59 | Admitting: Internal Medicine

## 2012-11-22 ENCOUNTER — Other Ambulatory Visit: Payer: Self-pay | Admitting: Internal Medicine

## 2012-11-22 NOTE — Telephone Encounter (Signed)
Refill done.  

## 2012-12-07 ENCOUNTER — Telehealth: Payer: Self-pay | Admitting: Internal Medicine

## 2012-12-07 NOTE — Telephone Encounter (Signed)
Discussed with pt, he states that he will call back later to schedule an appt to discuss this with dr. Drue Novel.

## 2012-12-07 NOTE — Telephone Encounter (Signed)
Please advise 

## 2012-12-07 NOTE — Telephone Encounter (Signed)
Patient would like to know if he can change from prozac to xanax. [facility] uses [facility]. Call back on mobile #.

## 2012-12-07 NOTE — Telephone Encounter (Signed)
They are not interchangable , If he feels he needs the Xanax instead of Prozac, I recommend to discuss when he comes back for a followup

## 2012-12-12 ENCOUNTER — Other Ambulatory Visit (INDEPENDENT_AMBULATORY_CARE_PROVIDER_SITE_OTHER): Payer: 59

## 2012-12-12 DIAGNOSIS — R202 Paresthesia of skin: Secondary | ICD-10-CM

## 2012-12-12 DIAGNOSIS — E119 Type 2 diabetes mellitus without complications: Secondary | ICD-10-CM

## 2012-12-12 DIAGNOSIS — R209 Unspecified disturbances of skin sensation: Secondary | ICD-10-CM

## 2012-12-12 DIAGNOSIS — D472 Monoclonal gammopathy: Secondary | ICD-10-CM

## 2012-12-12 LAB — BASIC METABOLIC PANEL
Chloride: 106 mEq/L (ref 96–112)
Creatinine, Ser: 1 mg/dL (ref 0.4–1.5)
Potassium: 4 mEq/L (ref 3.5–5.1)
Sodium: 137 mEq/L (ref 135–145)

## 2012-12-12 LAB — VITAMIN B12: Vitamin B-12: 650 pg/mL (ref 211–911)

## 2012-12-12 LAB — HEMOGLOBIN A1C: Hgb A1c MFr Bld: 5.8 % (ref 4.6–6.5)

## 2012-12-12 LAB — LDL CHOLESTEROL, DIRECT: Direct LDL: 111.7 mg/dL

## 2012-12-12 LAB — LIPID PANEL
Cholesterol: 181 mg/dL (ref 0–200)
Triglycerides: 233 mg/dL — ABNORMAL HIGH (ref 0.0–149.0)
VLDL: 46.6 mg/dL — ABNORMAL HIGH (ref 0.0–40.0)

## 2012-12-12 LAB — FOLATE: Folate: 23.5 ng/mL (ref >5.9)

## 2012-12-19 ENCOUNTER — Telehealth: Payer: Self-pay | Admitting: Internal Medicine

## 2012-12-19 NOTE — Telephone Encounter (Signed)
Left detailed msg on pt's vmail making pt aware testosterone was not checked.

## 2012-12-19 NOTE — Telephone Encounter (Signed)
Patient states he was supposed to have his testosterone level checked, but he has not received any results.

## 2012-12-31 ENCOUNTER — Encounter: Payer: Self-pay | Admitting: Internal Medicine

## 2012-12-31 ENCOUNTER — Ambulatory Visit (INDEPENDENT_AMBULATORY_CARE_PROVIDER_SITE_OTHER): Payer: 59 | Admitting: Internal Medicine

## 2012-12-31 VITALS — BP 140/84 | HR 63 | Temp 98.1°F | Wt 207.0 lb

## 2012-12-31 DIAGNOSIS — E291 Testicular hypofunction: Secondary | ICD-10-CM

## 2012-12-31 DIAGNOSIS — R202 Paresthesia of skin: Secondary | ICD-10-CM

## 2012-12-31 DIAGNOSIS — F411 Generalized anxiety disorder: Secondary | ICD-10-CM

## 2012-12-31 DIAGNOSIS — F419 Anxiety disorder, unspecified: Secondary | ICD-10-CM

## 2012-12-31 DIAGNOSIS — R209 Unspecified disturbances of skin sensation: Secondary | ICD-10-CM

## 2012-12-31 MED ORDER — ALPRAZOLAM 0.5 MG PO TABS: 0.5000 mg | ORAL_TABLET | Freq: Every evening | ORAL | Status: DC | PRN

## 2012-12-31 NOTE — Progress Notes (Signed)
  Subjective:    Patient ID: Joe Blanchard, male    DOB: 1961-06-16, 52 y.o.   MRN: [id number]  HPI Here to discuss alprazolam. Currently on Prozac, worked better than the previous SSRI . Patient  interested in trying Xanax, his main challenge remains waking up early, getting nervous about the task ahead of him that day. His sister has similar symptoms and does great with Xanax at night, wonders if he could try.  Past Medical History  Diagnosis Date  . Anxiety   . Colon polyps     Cscope 11-11, next 06-2011  . Diabetes mellitus dx 10-12   Past Surgical History  Procedure Laterality Date  . Knee surgery  12/03/08    scope     Review of Systems Otherwise feeling well. Good compliance with clomiphene, has not feel much different.     Objective:   Physical Exam General -- alert, well-developed  Neurologic-- alert & oriented X3 and strength normal in all extremities. Psych-- Cognition and judgment appear intact. Alert and cooperative with normal attention span and concentration.  not anxious appearing and not depressed appearing.      Assessment & Plan:  Today , I spent more than 15 min with the patient, >50% of the time counseling ,we review his latest labs together

## 2012-12-31 NOTE — Assessment & Plan Note (Signed)
Reports lower extremity paresthesias better since he switch to Prozac.

## 2012-12-31 NOTE — Assessment & Plan Note (Signed)
On clomiphene, was supposed to have labs few weeks ago, they were never done. Will check a testosterone level -->  Send  to Dr. Everardo All

## 2012-12-31 NOTE — Patient Instructions (Addendum)
Alprazolam: Take either 1/2, 1 or 1.5 tablets at bedtime. Next visit in 4 months for a checkup.

## 2012-12-31 NOTE — Assessment & Plan Note (Signed)
See history of present illness, thinks he will benefit from Xanax at night, I agree, potential for great benefit. Risk of abuse discussed See instructions.

## 2013-01-01 LAB — TESTOSTERONE, FREE, TOTAL, SHBG
Sex Hormone Binding: 28 nmol/L (ref 13–71)
Testosterone: 529 ng/dL (ref 300–890)

## 2013-01-29 ENCOUNTER — Telehealth: Payer: Self-pay | Admitting: Internal Medicine

## 2013-01-29 NOTE — Telephone Encounter (Signed)
Ok to refill? Last OV 5.19.14 Last filled 5.19.14

## 2013-01-29 NOTE — Telephone Encounter (Signed)
done

## 2013-03-06 ENCOUNTER — Ambulatory Visit: Payer: 59 | Admitting: Internal Medicine

## 2013-03-08 ENCOUNTER — Telehealth: Payer: Self-pay | Admitting: Internal Medicine

## 2013-03-08 NOTE — Telephone Encounter (Signed)
Ok to refill?  Last OV 5.19.14 Last filled 1.15.14 #90 with 1 RF

## 2013-03-11 NOTE — Telephone Encounter (Signed)
UDS low risk 08-2012, RF provided

## 2013-03-11 NOTE — Telephone Encounter (Signed)
Refill faxed to pharmacy.

## 2013-04-21 ENCOUNTER — Other Ambulatory Visit: Payer: Self-pay | Admitting: Internal Medicine

## 2013-04-22 NOTE — Telephone Encounter (Signed)
rx request ambien  Last OV- 12/26/12 No future OV  Last refill 03/08/13 #90 / 1 refill   rx request Prozac  Last refill 11/22/12 #60 / 3 refills

## 2013-04-24 ENCOUNTER — Telehealth: Payer: Self-pay | Admitting: Internal Medicine

## 2013-04-24 ENCOUNTER — Telehealth: Payer: Self-pay | Admitting: General Practice

## 2013-04-24 MED ORDER — FLUOXETINE HCL 20 MG PO TABS: ORAL_TABLET | ORAL | Status: DC

## 2013-04-24 NOTE — Telephone Encounter (Signed)
Ok x 3 months

## 2013-04-24 NOTE — Telephone Encounter (Signed)
Fluoxetine refill.  Last OV 12-31-12 Med last filled 11-22-12 #60 with 3 refills.   Controlled substance contract on file.   Low Risk   Last screen was due 02/2013

## 2013-04-24 NOTE — Telephone Encounter (Signed)
Refill already done. 

## 2013-04-24 NOTE — Telephone Encounter (Signed)
Med filled per PAz.

## 2013-04-30 ENCOUNTER — Telehealth: Payer: Self-pay | Admitting: *Deleted

## 2013-04-30 NOTE — Telephone Encounter (Signed)
Pt requesting refill on ambien.  Last OV 12/26/12 Last filled 03/08/13

## 2013-06-01 ENCOUNTER — Other Ambulatory Visit: Payer: Self-pay | Admitting: Internal Medicine

## 2013-06-03 ENCOUNTER — Telehealth: Payer: Self-pay | Admitting: *Deleted

## 2013-06-03 MED ORDER — ALPRAZOLAM 0.5 MG PO TABS: 0.5000 mg | ORAL_TABLET | Freq: Every evening | ORAL | Status: DC | PRN

## 2013-06-03 NOTE — Telephone Encounter (Signed)
ALPRAZolam (XANAX) 0.5 MG tablet Last OV: 12/31/2012 Last refill: 01/29/2013 #60, 3 refills UDS due: Low risk

## 2013-06-03 NOTE — Telephone Encounter (Signed)
RF x 1 month, due for a check up, let him know

## 2013-06-03 NOTE — Telephone Encounter (Signed)
Called and left message for patient informing him that Xanax script is ready for pick up and a UDS is required.

## 2013-06-20 ENCOUNTER — Other Ambulatory Visit: Payer: Self-pay

## 2013-07-07 ENCOUNTER — Other Ambulatory Visit: Payer: Self-pay | Admitting: Internal Medicine

## 2013-07-08 ENCOUNTER — Telehealth: Payer: Self-pay | Admitting: *Deleted

## 2013-07-08 NOTE — Telephone Encounter (Signed)
rx refill- xanax 0.5mg  Last ov- 01/01/23  Last refilled - 06/03/13 #60/ 0 rf  UDS- Low 08/31/12  Please advise. DJR

## 2013-07-08 NOTE — Telephone Encounter (Signed)
rx refill- xanax 0.5mg Last ov- 01/01/23  Last refilled - 06/03/13 #60/ 0 rf  UDS- Low 08/31/12  Please advise. DJR   

## 2013-07-09 MED ORDER — ALPRAZOLAM 0.5 MG PO TABS: 0.5000 mg | ORAL_TABLET | Freq: Every evening | ORAL | Status: DC | PRN

## 2013-07-09 NOTE — Addendum Note (Signed)
Addended by: Willow Ora E on: 07/09/2013 01:02 PM   Modules accepted: Orders, Medications

## 2013-07-09 NOTE — Telephone Encounter (Signed)
Done, advise pt he is due for a check up

## 2013-08-03 ENCOUNTER — Other Ambulatory Visit: Payer: Self-pay | Admitting: Internal Medicine

## 2013-08-05 ENCOUNTER — Telehealth: Payer: Self-pay | Admitting: *Deleted

## 2013-08-05 MED ORDER — ALPRAZOLAM 0.5 MG PO TABS: 0.5000 mg | ORAL_TABLET | Freq: Every evening | ORAL | Status: DC | PRN

## 2013-08-05 NOTE — Telephone Encounter (Signed)
rx refill - xanax 0.5mg  Last OV- 12/31/12 Last refilled 07/09/13 #60 / 0 rf UDS low 08/31/12

## 2013-08-05 NOTE — Telephone Encounter (Signed)
Advise patient, prescription printed, he is due for a visit

## 2013-08-05 NOTE — Telephone Encounter (Deleted)
rx refill - xanax 0.5mg Last OV- 12/31/12 Last refilled 07/09/13 #60 / 0 rf UDS low 08/31/12  

## 2013-08-05 NOTE — Addendum Note (Signed)
Addended by: Willow Ora E on: 08/05/2013 05:34 PM   Modules accepted: Orders

## 2013-08-26 ENCOUNTER — Encounter: Payer: Self-pay | Admitting: Internal Medicine

## 2013-08-26 ENCOUNTER — Ambulatory Visit (INDEPENDENT_AMBULATORY_CARE_PROVIDER_SITE_OTHER): Payer: 59 | Admitting: Internal Medicine

## 2013-08-26 VITALS — BP 120/73 | HR 70 | Temp 98.2°F | Wt 209.0 lb

## 2013-08-26 DIAGNOSIS — Z23 Encounter for immunization: Secondary | ICD-10-CM

## 2013-08-26 DIAGNOSIS — F419 Anxiety disorder, unspecified: Secondary | ICD-10-CM

## 2013-08-26 DIAGNOSIS — E119 Type 2 diabetes mellitus without complications: Secondary | ICD-10-CM

## 2013-08-26 NOTE — Progress Notes (Signed)
   Subjective:    Patient ID: Joe Blanchard, male    DOB: Aug 10, 1961, 53 y.o.   MRN: [id number]  HPI Here for a ROV,we discussed the following issues: anxiety- well controlled with the addition of Xanax at night Insomnia, well-controlled with Ambien Pre-diabetes--  diet is healthy, has not been exercising much lately  Past Medical History  Diagnosis Date  . Anxiety   . Colon polyps     Cscope 11-11, next 06-2011  . Diabetes mellitus dx 10-12   Past Surgical History  Procedure Laterality Date  . Knee surgery  12/03/08    scope   History   Social History  . Marital Status: Married    Spouse Name: N/A    Number of Children: 2  . 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 Smoking status: Never Smoker   . Smokeless tobacco: Never Used  . Alcohol Use: 1.8 oz/week    3 Glasses of wine per week     Comment: socially   . Drug Use: No  . Sexual Activity: Yes    Partners: Female   Other Topics Concern  . Not on file   Social History Narrative  . No narrative on file   Review of Systems Denies chest pain or difficulty breathing On clomiphen for hypogonadism, good compliance, denies any dysuria or difficulty urinating.     Objective:   Physical Exam  BP 120/73  Pulse 70  Temp(Src) 98.2 F (36.8 C)  Wt 209 lb (94.802 kg)  SpO2 96% General -- alert, well-developed, NAD.  Lungs -- normal respiratory effort, no intercostal retractions, no accessory muscle use, and normal breath sounds.  Heart-- normal rate, regular rhythm, no murmur.  Neurologic--  alert & oriented X3.   Psych-- Cognition and judgment appear intact. Cooperative with normal attention span and concentration. No anxious or depressed appearing.     Assessment & Plan:

## 2013-08-26 NOTE — Assessment & Plan Note (Signed)
Diet and exercise encouraged

## 2013-08-26 NOTE — Progress Notes (Signed)
Pre visit review using our clinic review tool, if applicable. No additional management support is needed unless otherwise documented below in the visit note. 

## 2013-08-26 NOTE — Patient Instructions (Signed)
Next visit is for a physical exam in ,  fasting Please make an appointment

## 2013-08-26 NOTE — Assessment & Plan Note (Addendum)
Doing great with Xanax at bedtime, symptoms we'll control. States had a UDS recently, will get the report. On Ambien to help w/ insomnia, that works well

## 2013-08-27 ENCOUNTER — Telehealth: Payer: Self-pay

## 2013-08-27 ENCOUNTER — Encounter: Payer: Self-pay | Admitting: Internal Medicine

## 2013-08-27 NOTE — Telephone Encounter (Signed)
Relevant patient education assigned to patient using Emmi. ° °

## 2013-08-29 ENCOUNTER — Telehealth: Payer: Self-pay

## 2013-08-29 NOTE — Telephone Encounter (Addendum)
UDS; 06/07/2013 Positive-Zolpidem and Fluoxetine Negative--Alprazolam--takes at night as needed LOW risk--per Dr Larose Kells

## 2013-09-05 ENCOUNTER — Encounter: Payer: Self-pay | Admitting: Internal Medicine

## 2013-09-05 NOTE — Telephone Encounter (Signed)
error 

## 2013-09-08 ENCOUNTER — Other Ambulatory Visit: Payer: Self-pay | Admitting: Internal Medicine

## 2013-09-09 ENCOUNTER — Telehealth: Payer: Self-pay | Admitting: *Deleted

## 2013-09-09 MED ORDER — ALPRAZOLAM 0.5 MG PO TABS: 0.5000 mg | ORAL_TABLET | Freq: Every evening | ORAL | Status: DC | PRN

## 2013-09-09 NOTE — Addendum Note (Signed)
Addended by: Kathlene November E on: 09/09/2013 01:09 PM   Modules accepted: Orders

## 2013-09-09 NOTE — Telephone Encounter (Signed)
rx refill - Xanax 0.5mg  Last OV- 08/26/13 Last refilled - 08/05/13 #60 / 0 rf  Last UDS 06/06/13 LOW

## 2013-09-09 NOTE — Telephone Encounter (Signed)
done

## 2013-09-11 ENCOUNTER — Other Ambulatory Visit: Payer: Self-pay | Admitting: Internal Medicine

## 2013-09-12 ENCOUNTER — Telehealth: Payer: Self-pay | Admitting: *Deleted

## 2013-09-12 NOTE — Telephone Encounter (Addendum)
UDS 10-14 low Ok RF #90, and 1 RF

## 2013-09-12 NOTE — Telephone Encounter (Signed)
rx refill - ambien 10mg  Last ov- 08/26/13 Last refilled- 02/16/13 #90 / 1 rf

## 2013-09-12 NOTE — Telephone Encounter (Deleted)
rx refill - ambien 10mg Last ov- 08/26/13 Last refilled- 02/16/13 #90 / 1 rf  

## 2013-09-13 MED ORDER — ZOLPIDEM TARTRATE 10 MG PO TABS: ORAL_TABLET | ORAL | Status: DC

## 2013-09-13 NOTE — Addendum Note (Signed)
Addended by: Peggyann Shoals on: 09/13/2013 10:25 AM   Modules accepted: Orders

## 2013-09-14 ENCOUNTER — Other Ambulatory Visit: Payer: Self-pay | Admitting: Internal Medicine

## 2013-09-16 NOTE — Telephone Encounter (Signed)
Fluoxetine refilled per protocol. JG//CMA 

## 2013-10-09 ENCOUNTER — Telehealth: Payer: Self-pay | Admitting: Internal Medicine

## 2013-10-09 ENCOUNTER — Encounter (HOSPITAL_COMMUNITY): Payer: Self-pay | Admitting: Emergency Medicine

## 2013-10-09 ENCOUNTER — Emergency Department (HOSPITAL_COMMUNITY)
Admission: EM | Admit: 2013-10-09 | Discharge: 2013-10-09 | Disposition: A | Discharge status: Home / Self Care | Payer: 59 | Source: Home / Self Care | Attending: Family Medicine | Admitting: Family Medicine

## 2013-10-09 DIAGNOSIS — S0100XA Unspecified open wound of scalp, initial encounter: Secondary | ICD-10-CM

## 2013-10-09 DIAGNOSIS — S0101XA Laceration without foreign body of scalp, initial encounter: Secondary | ICD-10-CM
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
## 2013-10-09 NOTE — Discharge Instructions (Signed)
Tissue Adhesive Wound Care °Some cuts, wounds, lacerations, and incisions can be repaired by using tissue adhesive. Tissue adhesive is like glue. It holds the skin together, allowing for faster healing. It forms a strong bond on the skin in about 1 minute and reaches its full strength in about 2 or 3 minutes. The adhesive disappears naturally while the wound is healing. It is important to take proper care of your wound at home while it heals.  °HOME CARE INSTRUCTIONS  °· Showers are allowed. Do not soak the area containing the tissue adhesive. Do not take baths, swim, or use hot tubs. Do not use any soaps or ointments on the wound. Certain ointments can weaken the glue. °· If a bandage (dressing) has been applied, follow your health care provider's instructions for how often to change the dressing.   °· Keep the dressing dry if one has been applied.   °· Do not scratch, pick, or rub the adhesive.   °· Do not place tape over the adhesive. The adhesive could come off when pulling the tape off.   °· Protect the wound from further injury until it is healed.   °· Protect the wound from sun and tanning bed exposure while it is healing and for several weeks after healing.   °· Only take over-the-counter or prescription medicines as directed by your health care provider.   °· Keep all follow-up appointments as directed by your health care provider. °SEEK IMMEDIATE MEDICAL CARE IF:  °· Your wound becomes red, swollen, hot, or tender.   °· You develop a rash after the glue is applied. °· You have increasing pain in the wound.   °· You have a red streak that goes away from the wound.   °· You have pus coming from the wound.   °· You have increased bleeding. °· You have a fever. °· You have shaking chills.   °· You notice a bad smell coming from the wound.   °· Your wound or adhesive breaks open.   °MAKE SURE YOU:  °· Understand these instructions. °· Will watch your condition. °· Will get help right away if you are not doing  well or get worse. °Document Released: 01/25/2001 Document Revised: 05/22/2013 Document Reviewed: 02/20/2013 °ExitCare® Patient Information ©2014 [employer]. ° °
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
## 2013-10-11 NOTE — ED Provider Notes (Signed)
Medical screening examination/treatment/procedure(s) were performed by a resident physician or non-physician practitioner and as the supervising physician I was immediately available for consultation/collaboration.  Lynne Leader, MD   Gregor Hams, MD 10/11/13 [phone]

## 2013-10-16 NOTE — ED Provider Notes (Signed)
Medical screening examination/treatment/procedure(s) were performed by a resident physician or non-physician practitioner and as the supervising physician I was immediately available for consultation/collaboration.  Scarleth Brame, MD   Haden Suder S Kahiau Schewe, MD 10/16/13 0747 

## 2013-11-06 ENCOUNTER — Other Ambulatory Visit: Payer: Self-pay | Admitting: Endocrinology

## 2013-11-06 ENCOUNTER — Other Ambulatory Visit: Payer: Self-pay | Admitting: *Deleted

## 2013-11-06 NOTE — Telephone Encounter (Signed)
Refill x 1 Ov is due 

## 2013-11-19 ENCOUNTER — Telehealth: Payer: Self-pay

## 2013-11-19 NOTE — Telephone Encounter (Addendum)
Left message for call back  identifiable  Medication List and allergies:  Reviewed and updated  90 day supply/mail order: na Local prescriptions: [facility]  Immunizations due: UTD  A/P:   No changes to FH, PSH or Personal Hx Flu vaccine--08/2013 Tdap--2012 [facility]--08/2011--Dr Perry--Benign polyps--FH--next due 08/2014 PSA--09/2012--0.69  To Discuss with Provider: Look at vein in leg--understands that this is his CPE Will make another appt if appropriate

## 2013-11-20 ENCOUNTER — Ambulatory Visit (INDEPENDENT_AMBULATORY_CARE_PROVIDER_SITE_OTHER): Payer: 59 | Admitting: Internal Medicine

## 2013-11-20 ENCOUNTER — Encounter: Payer: Self-pay | Admitting: Internal Medicine

## 2013-11-20 VITALS — BP 119/81 | HR 61 | Temp 97.9°F | Ht 71.2 in | Wt 200.0 lb

## 2013-11-20 DIAGNOSIS — F419 Anxiety disorder, unspecified: Secondary | ICD-10-CM

## 2013-11-20 DIAGNOSIS — Z0000 Encounter for general adult medical examination without abnormal findings: Secondary | ICD-10-CM

## 2013-11-20 DIAGNOSIS — E119 Type 2 diabetes mellitus without complications: Secondary | ICD-10-CM

## 2013-11-20 LAB — CBC WITH DIFFERENTIAL/PLATELET
BASOS ABS: 0 10*3/uL (ref 0.0–0.1)
Basophils Relative: 0.4 % (ref 0.0–3.0)
EOS ABS: 0.1 10*3/uL (ref 0.0–0.7)
Eosinophils Relative: 1.1 % (ref 0.0–5.0)
HEMATOCRIT: 46 % (ref 39.0–52.0)
Hemoglobin: 15.6 g/dL (ref 13.0–17.0)
LYMPHS ABS: 1.4 10*3/uL (ref 0.7–4.0)
Lymphocytes Relative: 23.7 % (ref 12.0–46.0)
MCHC: 33.8 g/dL (ref 30.0–36.0)
MCV: 85.7 fl (ref 78.0–100.0)
MONO ABS: 0.3 10*3/uL (ref 0.1–1.0)
MONOS PCT: 4.5 % (ref 3.0–12.0)
Neutro Abs: 4.1 10*3/uL (ref 1.4–7.7)
Neutrophils Relative %: 70.3 % (ref 43.0–77.0)
PLATELETS: 199 10*3/uL (ref 150.0–400.0)
RBC: 5.38 Mil/uL (ref 4.22–5.81)
RDW: 13.1 % (ref 11.5–14.6)
WBC: 5.8 10*3/uL (ref 4.5–10.5)

## 2013-11-20 LAB — COMPREHENSIVE METABOLIC PANEL
ALT: 28 U/L (ref 0–53)
AST: 26 U/L (ref 0–37)
Albumin: 4.4 g/dL (ref 3.5–5.2)
Alkaline Phosphatase: 51 U/L (ref 39–117)
BILIRUBIN TOTAL: 0.6 mg/dL (ref 0.3–1.2)
BUN: 15 mg/dL (ref 6–23)
CHLORIDE: 102 meq/L (ref 96–112)
CO2: 25 meq/L (ref 19–32)
Calcium: 9.5 mg/dL (ref 8.4–10.5)
Creatinine, Ser: 1 mg/dL (ref 0.4–1.5)
GFR: 80.33 mL/min (ref >60.00)
Glucose, Bld: 106 mg/dL — ABNORMAL HIGH (ref 70–99)
Potassium: 4.1 mEq/L (ref 3.5–5.1)
Sodium: 136 mEq/L (ref 135–145)
Total Protein: 7.5 g/dL (ref 6.0–8.3)

## 2013-11-20 LAB — LIPID PANEL
Cholesterol: 197 mg/dL (ref 0–200)
HDL: 38.4 mg/dL — AB (ref >39.00)
LDL Cholesterol: 111 mg/dL — ABNORMAL HIGH (ref 0–99)
Total CHOL/HDL Ratio: 5
Triglycerides: 240 mg/dL — ABNORMAL HIGH (ref 0.0–149.0)
VLDL: 48 mg/dL — ABNORMAL HIGH (ref 0.0–40.0)

## 2013-11-20 LAB — TSH: TSH: 2.92 u[IU]/mL (ref 0.35–5.50)

## 2013-11-20 LAB — PSA: PSA: 0.89 ng/mL (ref 0.10–4.00)

## 2013-11-20 LAB — HEMOGLOBIN A1C: HEMOGLOBIN A1C: 5.9 % (ref 4.6–6.5)

## 2013-11-20 NOTE — Patient Instructions (Signed)
Get your blood work before you leave     Next visit is for routine check up regards your blood sugar   in 6 months  No need to come back fasting Please make an appointment       All about diabetes, great resource! Good info about diet [email or web address]     Varicose Veins Varicose veins are veins that have become enlarged and twisted. CAUSES This condition is the result of valves in the veins not working properly. Valves in the veins help return blood from the leg to the heart. If these valves are damaged, blood flows backwards and backs up into the veins in the leg near the skin. This causes the veins to become larger. People who are on their feet a lot, who are pregnant, or who are overweight are more likely to develop varicose veins. SYMPTOMS   Bulging, twisted-appearing, bluish veins, most commonly found on the legs.  Leg pain or a feeling of heaviness. These symptoms may be worse at the end of the day.  Leg swelling.  Skin color changes. DIAGNOSIS  Varicose veins can usually be diagnosed with an exam of your legs by your caregiver. He or she may recommend an ultrasound of your leg veins. TREATMENT  Most varicose veins can be treated at home.However, other treatments are available for people who have persistent symptoms or who want to treat the cosmetic appearance of the varicose veins. These include:  Laser treatment of very small varicose veins.  Medicine that is shot (injected) into the vein. This medicine hardens the walls of the vein and closes off the vein. This treatment is called sclerotherapy. Afterwards, you may need to wear clothing or bandages that apply pressure.  Surgery. HOME CARE INSTRUCTIONS   Do not stand or sit in one position for long periods of time. Do not sit with your legs crossed. Rest with your legs raised during the day.  Wear elastic stockings or support hose. Do not wear other tight, encircling garments around  the legs, pelvis, or waist.  Walk as much as possible to increase blood flow.  Raise the foot of your bed at night with 2-inch blocks.  If you get a cut in the skin over the vein and the vein bleeds, lie down with your leg raised and press on it with a clean cloth until the bleeding stops. Then place a bandage (dressing) on the cut. See your caregiver if it continues to bleed or needs stitches. SEEK MEDICAL CARE IF:   The skin around your ankle starts to break down.  You have pain, redness, tenderness, or hard swelling developing in your leg over a vein.  You are uncomfortable due to leg pain. Document Released: 05/11/2005 Document Revised: 10/24/2011 Document Reviewed: 09/27/2010 [facility] Patient Information 2014 [place].

## 2013-11-20 NOTE — Progress Notes (Signed)
Pre visit review using our clinic review tool, if applicable. No additional management support is needed unless otherwise documented below in the visit note. 

## 2013-11-20 NOTE — Assessment & Plan Note (Addendum)
Td 2012 + family history of prostate cancer -->  normal DRE today, check a PSA + FH colon cancer, Cscope 11-11 and 08-2011; next 2016  Diet & exercise discussed Labs  Has varicose veins, recommend to compression stockings , educated  about the issue.Marland Kitchen\

## 2013-11-20 NOTE — Assessment & Plan Note (Addendum)
Symptoms well-controlled, no change, uds low risk 08-2013

## 2013-11-20 NOTE — Progress Notes (Signed)
Subjective:    Patient ID: Joe Blanchard, male    DOB: 22-Jan-1961, 53 y.o.   MRN: [id number]  DOS:  11/20/2013 Type of  visit:  CPX  Concerned about  varicose veins      ROS Diet-- "decent" Exercise-- active at work , no routine DM---  Feet cold and clammy at rest, normal w/ exertion; (-) visual disturbances   No  CP, SOB No palpitations, no lower extremity edema Denies  nausea, vomiting diarrhea  Denies  blood in the stools (-) cough, sputum production (-) wheezing, chest congestion No dysuria, gross hematuria, difficulty urinating     Past Medical History  Diagnosis Date  . Anxiety   . Colon polyps     Cscope 11-11, next 06-2011  . Diabetes mellitus dx 10-12    Past Surgical History  Procedure Laterality Date  . Knee surgery  12/03/08    scope  . Scalp laceration repair  ~09-2013    History   Social History  . Marital Status: Married    Spouse Name: N/A    Number of Children: 2  . 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 Smoking status: Never Smoker   . Smokeless tobacco: Never Used  . Alcohol Use: 1.8 oz/week    3 Glasses of wine per week     Comment: socially   . Drug Use: No  . Sexual Activity: Yes    Partners: Female   Other Topics Concern  . Not on file   Social History Narrative   Lives w/ wife     Family History  Problem Relation Age of Onset  . Colon cancer Father     dx in his 46s  . Lupus Sister     anticoagulant  . Heart disease Other     GF  . Colon polyps Other   . Prostate cancer Father     dx in his 46s?  . Diabetes Neg Hx        Medication List       This list is accurate as of: 11/20/13  5:27 PM.  Always use your most recent med list.               acetaminophen 500 MG tablet  Commonly known as:  TYLENOL  Take 500 mg by mouth every 6 (six) hours as needed.     ALPRAZolam 0.5 MG tablet  Commonly known as:  XANAX  Take 1-2 tablets (0.5-1  mg total) by mouth at bedtime as needed for sleep.     clomiPHENE 50 MG tablet  Commonly known as:  CLOMID  TAKE 1/4 TAB BY MOUTH DAILY     [facility] IBUPROFEN PO  Take by mouth as needed.     FLUoxetine 20 MG tablet  Commonly known as:  PROZAC  TAKE 1 & 1/2 TABLETS BY MOUTH DAILY     zolpidem 10 MG tablet  Commonly known as:  AMBIEN  TAKE 1 TABLET BY MOUTH AT BEDTIME AS NEEDED           Objective:   Physical Exam BP 119/81  Pulse 61  Temp(Src) 97.9 F (36.6 C)  Ht 5' 11.2" (1.808 m)  Wt 200 lb (90.719 kg)  BMI 27.75 kg/m2  SpO2 98%  General -- alert, well-developed, NAD.  Neck --no thyromegaly  HEENT-- Not pale.  Lungs -- normal respiratory effort, no intercostal retractions, no accessory  muscle use, and normal breath sounds.  Heart-- normal rate, regular rhythm, no murmur.  Abdomen-- Not distended, good bowel sounds,soft, non-tender. Rectal-- No external abnormalities noted. Normal sphincter tone. No rectal masses or tenderness. Brown stool Prostate--Prostate gland firm and smooth, no enlargement, nodularity, tenderness, mass, asymmetry or induration. Extremities-- no pretibial edema bilaterally ; Small bilateral varicose veins without phlebitis. Good pedal pulses and capillary refills. Neurologic--  alert & oriented X3. Speech normal, gait normal, strength normal in all extremities.  Psych-- Cognition and judgment appear intact. Cooperative with normal attention span and concentration. No anxious or depressed appearing.        Assessment & Plan:

## 2013-11-20 NOTE — Assessment & Plan Note (Signed)
Discussed diet and exercise, check an A1c, follow up in 6 months

## 2013-12-10 ENCOUNTER — Telehealth: Payer: Self-pay | Admitting: Internal Medicine

## 2013-12-10 MED ORDER — HYDROCORTISONE ACETATE 25 MG RE SUPP: 25.0000 mg | Freq: Two times a day (BID) | RECTAL | Status: DC | PRN

## 2013-12-10 NOTE — Telephone Encounter (Signed)
Caller name:[name] Relation to CV:UDTHYHO Call back [phone] [facility] [address]  Reason for call: Patient called and stated that his hemorrhoids are flaring up again. Patient is using the ointment cream ( hydrocortisone) Dr Larose Kells gave him two years ago but patient wanted to know is there something else dr Larose Kells can prescribe for him. Please advise.

## 2013-12-10 NOTE — Telephone Encounter (Signed)
Done pt notified.  

## 2013-12-10 NOTE — Telephone Encounter (Signed)
Get a OTC ointment called NUPERCAINAL, will help w/ pain itching I just sent a Rx for suppositories BID as needed OV if not better or severe sx

## 2014-01-06 ENCOUNTER — Other Ambulatory Visit: Payer: Self-pay | Admitting: Internal Medicine

## 2014-01-07 ENCOUNTER — Telehealth: Payer: Self-pay | Admitting: *Deleted

## 2014-01-07 MED ORDER — ALPRAZOLAM 0.5 MG PO TABS: 0.5000 mg | ORAL_TABLET | Freq: Every evening | ORAL | Status: DC | PRN

## 2014-01-07 NOTE — Telephone Encounter (Signed)
Xanax 0.5mg   Last OV- 11/20/13  Last refilled- 09/09/13 #60 / 3 rf  UDS- 06/06/13 LOW risk

## 2014-01-07 NOTE — Telephone Encounter (Signed)
done

## 2014-01-07 NOTE — Telephone Encounter (Signed)
rx faxed to [facility] [place]

## 2014-02-04 ENCOUNTER — Other Ambulatory Visit: Payer: Self-pay | Admitting: Internal Medicine

## 2014-03-07 ENCOUNTER — Other Ambulatory Visit: Payer: Self-pay | Admitting: Internal Medicine

## 2014-03-07 ENCOUNTER — Telehealth: Payer: Self-pay | Admitting: *Deleted

## 2014-03-07 MED ORDER — ZOLPIDEM TARTRATE 10 MG PO TABS: ORAL_TABLET | ORAL | Status: DC

## 2014-03-07 NOTE — Telephone Encounter (Signed)
Ok for #30, no refills 

## 2014-03-07 NOTE — Telephone Encounter (Signed)
[facility]

## 2014-03-07 NOTE — Telephone Encounter (Signed)
rx refill- ambien 10 mg  Last OV- 11/20/13 Lat refilled- 01/07/14 #60 / 3 rf  UDS- 06/07/13 Low risk

## 2014-04-07 ENCOUNTER — Telehealth: Payer: Self-pay

## 2014-04-07 MED ORDER — ZOLPIDEM TARTRATE 10 MG PO TABS: ORAL_TABLET | ORAL | Status: DC

## 2014-04-07 NOTE — Telephone Encounter (Signed)
Pt is requesting refill for Ambien.   Last OV: 11/30/2013 Last fill: 03/07/2014 #30 with 0 RF Last UDS: 06/07/2013: Low risk   Please Advise.

## 2014-04-07 NOTE — Telephone Encounter (Signed)
Medication faxed to Pharmacy.  

## 2014-04-07 NOTE — Telephone Encounter (Signed)
Rx printed (has an appointment pending w/ me)

## 2014-05-02 ENCOUNTER — Observation Stay (HOSPITAL_COMMUNITY)
Admission: EM | Admit: 2014-05-02 | Discharge: 2014-05-03 | Disposition: A | Discharge status: Home / Self Care | Payer: 59 | Attending: Internal Medicine | Admitting: Internal Medicine

## 2014-05-02 ENCOUNTER — Encounter (HOSPITAL_COMMUNITY): Payer: Self-pay | Admitting: Emergency Medicine

## 2014-05-02 ENCOUNTER — Telehealth: Payer: Self-pay | Admitting: Internal Medicine

## 2014-05-02 ENCOUNTER — Emergency Department (HOSPITAL_COMMUNITY): Payer: 59

## 2014-05-02 DIAGNOSIS — F411 Generalized anxiety disorder: Secondary | ICD-10-CM | POA: Insufficient documentation

## 2014-05-02 DIAGNOSIS — R002 Palpitations: Secondary | ICD-10-CM | POA: Diagnosis not present

## 2014-05-02 DIAGNOSIS — E781 Pure hyperglyceridemia: Secondary | ICD-10-CM | POA: Diagnosis not present

## 2014-05-02 DIAGNOSIS — E119 Type 2 diabetes mellitus without complications: Secondary | ICD-10-CM | POA: Diagnosis not present

## 2014-05-02 DIAGNOSIS — Z0000 Encounter for general adult medical examination without abnormal findings: Secondary | ICD-10-CM

## 2014-05-02 DIAGNOSIS — E785 Hyperlipidemia, unspecified: Secondary | ICD-10-CM | POA: Diagnosis present

## 2014-05-02 DIAGNOSIS — R5383 Other fatigue: Secondary | ICD-10-CM

## 2014-05-02 DIAGNOSIS — R5381 Other malaise: Secondary | ICD-10-CM | POA: Insufficient documentation

## 2014-05-02 DIAGNOSIS — Z860100 Personal history of colon polyps, unspecified: Secondary | ICD-10-CM | POA: Insufficient documentation

## 2014-05-02 DIAGNOSIS — Z79899 Other long term (current) drug therapy: Secondary | ICD-10-CM | POA: Insufficient documentation

## 2014-05-02 DIAGNOSIS — R079 Chest pain, unspecified: Principal | ICD-10-CM | POA: Insufficient documentation

## 2014-05-02 DIAGNOSIS — F419 Anxiety disorder, unspecified: Secondary | ICD-10-CM | POA: Diagnosis present

## 2014-05-02 DIAGNOSIS — R739 Hyperglycemia, unspecified: Secondary | ICD-10-CM | POA: Diagnosis present

## 2014-05-02 DIAGNOSIS — R7303 Prediabetes: Secondary | ICD-10-CM

## 2014-05-02 DIAGNOSIS — E291 Testicular hypofunction: Secondary | ICD-10-CM

## 2014-05-02 DIAGNOSIS — R209 Unspecified disturbances of skin sensation: Secondary | ICD-10-CM

## 2014-05-02 DIAGNOSIS — R202 Paresthesia of skin: Secondary | ICD-10-CM | POA: Diagnosis present

## 2014-05-02 HISTORY — DX: Pure hyperglyceridemia: E78.1

## 2014-05-02 LAB — CBC WITH DIFFERENTIAL/PLATELET
BASOS ABS: 0 10*3/uL (ref 0.0–0.1)
Basophils Relative: 0 % (ref 0–1)
EOS PCT: 0 % (ref 0–5)
Eosinophils Absolute: 0 10*3/uL (ref 0.0–0.7)
HCT: 45.7 % (ref 39.0–52.0)
Hemoglobin: 15.5 g/dL (ref 13.0–17.0)
LYMPHS ABS: 0.9 10*3/uL (ref 0.7–4.0)
Lymphocytes Relative: 17 % (ref 12–46)
MCH: 29.1 pg (ref 26.0–34.0)
MCHC: 33.9 g/dL (ref 30.0–36.0)
MCV: 85.9 fL (ref 78.0–100.0)
Monocytes Absolute: 0.3 10*3/uL (ref 0.1–1.0)
Monocytes Relative: 5 % (ref 3–12)
NEUTROS PCT: 78 % — AB (ref 43–77)
Neutro Abs: 4.3 10*3/uL (ref 1.7–7.7)
PLATELETS: 180 10*3/uL (ref 150–400)
RBC: 5.32 MIL/uL (ref 4.22–5.81)
RDW: 12.6 % (ref 11.5–15.5)
WBC: 5.6 10*3/uL (ref 4.0–10.5)

## 2014-05-02 LAB — BASIC METABOLIC PANEL
ANION GAP: 14 (ref 5–15)
BUN: 12 mg/dL (ref 6–23)
CHLORIDE: 103 meq/L (ref 96–112)
CO2: 25 meq/L (ref 19–32)
CREATININE: 0.87 mg/dL (ref 0.50–1.35)
Calcium: 9.6 mg/dL (ref 8.4–10.5)
GFR calc non Af Amer: >90 mL/min (ref >90)
Glucose, Bld: 105 mg/dL — ABNORMAL HIGH (ref 70–99)
Potassium: 3.9 mEq/L (ref 3.7–5.3)
SODIUM: 142 meq/L (ref 137–147)

## 2014-05-02 LAB — TROPONIN I: Troponin I: <0.3 ng/mL (ref <0.30)

## 2014-05-02 LAB — I-STAT TROPONIN, ED: TROPONIN I, POC: 0 ng/mL (ref 0.00–0.08)

## 2014-05-02 MED ORDER — PANTOPRAZOLE SODIUM 40 MG PO TBEC
40.0000 mg | DELAYED_RELEASE_TABLET | Freq: Two times a day (BID) | ORAL | Status: DC
Start: 2014-05-02 — End: 2014-05-03
  Administered 2014-05-02 – 2014-05-03 (×2): 40 mg via ORAL
  Filled 2014-05-02 (×2): qty 1

## 2014-05-02 MED ORDER — ONDANSETRON HCL 4 MG/2ML IJ SOLN
4.0000 mg | Freq: Four times a day (QID) | INTRAMUSCULAR | Status: DC | PRN
Start: 2014-05-02 — End: 2014-05-03

## 2014-05-02 MED ORDER — ALPRAZOLAM 0.5 MG PO TABS: 0.5000 mg | ORAL_TABLET | Freq: Every evening | ORAL | Status: DC | PRN

## 2014-05-02 MED ORDER — ONDANSETRON HCL 4 MG PO TABS: 4.0000 mg | ORAL_TABLET | Freq: Four times a day (QID) | ORAL | Status: DC | PRN

## 2014-05-02 MED ORDER — ASPIRIN 81 MG PO CHEW
324.0000 mg | CHEWABLE_TABLET | Freq: Once | ORAL | Status: AC
  Administered 2014-05-02: 324 mg via ORAL
  Filled 2014-05-02: qty 4

## 2014-05-02 MED ORDER — SODIUM CHLORIDE 0.9 % IJ SOLN: 3.0000 mL | Freq: Two times a day (BID) | INTRAMUSCULAR | Status: DC

## 2014-05-02 MED ORDER — ZOLPIDEM TARTRATE 5 MG PO TABS: 10.0000 mg | ORAL_TABLET | Freq: Every evening | ORAL | Status: DC | PRN

## 2014-05-02 MED ORDER — NITROGLYCERIN 0.4 MG SL SUBL
0.4000 mg | SUBLINGUAL_TABLET | SUBLINGUAL | Status: DC | PRN
  Administered 2014-05-02 (×2): 0.4 mg via SUBLINGUAL
  Filled 2014-05-02 (×2): qty 1

## 2014-05-02 MED ORDER — ENOXAPARIN SODIUM 40 MG/0.4ML ~~LOC~~ SOLN
40.0000 mg | SUBCUTANEOUS | Status: DC
  Administered 2014-05-02: 40 mg via SUBCUTANEOUS
  Filled 2014-05-02 (×2): qty 0.4

## 2014-05-02 MED ORDER — FLUOXETINE HCL 20 MG PO TABS
30.0000 mg | ORAL_TABLET | Freq: Every day | ORAL | Status: DC
  Administered 2014-05-03: 30 mg via ORAL
  Filled 2014-05-02: qty 2

## 2014-05-02 MED ORDER — ACETAMINOPHEN 650 MG RE SUPP: 650.0000 mg | Freq: Four times a day (QID) | RECTAL | Status: DC | PRN

## 2014-05-02 MED ORDER — ASPIRIN EC 325 MG PO TBEC
325.0000 mg | DELAYED_RELEASE_TABLET | Freq: Every day | ORAL | Status: DC
Start: 2014-05-03 — End: 2014-05-03
  Filled 2014-05-02: qty 1

## 2014-05-02 MED ORDER — ACETAMINOPHEN 325 MG PO TABS
650.0000 mg | ORAL_TABLET | Freq: Four times a day (QID) | ORAL | Status: DC | PRN
Start: 2014-05-02 — End: 2014-05-03

## 2014-05-02 NOTE — Telephone Encounter (Signed)
Spoke with wife who stated that they have performed an EKG, lab work and x-ray and then ask that the patient sit back out in the lobby. The wife was calling to making sure that this was proper protocol.  Husband continues to complain of chest pain and mild left arm pain.  Wife is worried about her husband and wants to know what to do next.  Wife was instructed not to allow her husband to leave, but to request to speak to the nurse/charge nurse for an update in regards to what the next steps were.  While on the phone, a nurse came out to speak to them.  Wife thanked me for calling back and hung up.

## 2014-05-02 NOTE — Telephone Encounter (Signed)
Please call Joe Blanchard , what advise was pt given?

## 2014-05-02 NOTE — H&P (Signed)
Patient's PCP: Kathlene November, MD  Chief Complaint: Chest pain  History of Present Illness: Joe Blanchard is a 53 y.o. Caucasian male with history of anxiety and diabetes in the past which is well controlled and currently not on any diabetic medications who presents with the above complaints.  Patient indicates that over the last 2 weeks he has had constant left-sided midsternal chest pain.  He indicated that with activity he forgets about the chest pain.  Last night after dinner, unfortunately he had an episode which lasted 1 minute where he felt dizzy, lightheaded, diaphoretic, and flushed.  He contacted his primary care physician who instructed him to the emergency department for further evaluation.  He denies any fevers, or chills, nausea, vomiting, shortness of breath, diarrhea, headaches or vision changes.  He did complain of mild burning abdominal pain.  Review of Systems: All systems reviewed with the patient and positive as per history of present illness, otherwise all other systems are negative.  Past Medical History  Diagnosis Date  . Anxiety   . Colon polyps     Cscope 11-11, next 06-2011  . Diabetes mellitus dx 10-12   Past Surgical History  Procedure Laterality Date  . Knee surgery  12/03/08    scope  . Scalp laceration repair  ~09-2013   Family History  Problem Relation Age of Onset  . Colon cancer Father     dx in his 46s  . Lupus Sister     anticoagulant  . Heart disease Other     GF  . Colon polyps Other   . Prostate cancer Father     dx in his 47s?  . Diabetes Neg Hx    History   Social History  . Marital Status: Married    Spouse Name: N/A    Number of Children: 2  . 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 Smoking status: Never Smoker   . Smokeless tobacco: Never Used  . Alcohol Use: 1.8 oz/week    3 Glasses of wine per week     Comment: socially   . Drug Use: No  . Sexual  Activity: Yes    Partners: Female   Other Topics Concern  . Not on file   Social History Narrative   Lives w/ wife   Allergies: Review of patient's allergies indicates no known allergies.  Home Meds: Prior to Admission medications   Medication Sig Start Date End Date Taking? Authorizing Provider  acetaminophen (TYLENOL) 500 MG tablet Take 500 mg by mouth every 6 (six) hours as needed.   Yes Historical Provider, MD  ALPRAZolam Duanne Moron) 0.5 MG tablet Take 1-2 tablets (0.5-1 mg total) by mouth at bedtime as needed for sleep. 01/07/14  Yes Colon Branch, MD  FLUoxetine (PROZAC) 20 MG tablet Take 30 mg by mouth daily.   Yes Historical Provider, MD  Omega-3 Fatty Acids (FISH OIL PO) Take 1 capsule by mouth daily.   Yes Historical Provider, MD  zolpidem (AMBIEN) 10 MG tablet Take 10 mg by mouth at bedtime as needed for sleep.   Yes Historical Provider, MD    Physical Exam: Blood pressure 129/72, pulse 54, temperature 98.3 F (36.8 C), temperature source Oral, resp. rate 18, SpO2 100.00%. General: Awake, Oriented x3, No acute distress. HEENT: EOMI, Moist mucous membranes Neck: Supple CV: S1 and S2 Chest: Chest pain not reproducible on palpation of his chest. Lungs: Clear  to ascultation bilaterally Abdomen: Soft, Nontender, Nondistended, +bowel sounds. Ext: Good pulses. Trace edema. No clubbing or cyanosis noted. Neuro: Cranial Nerves II-XII grossly intact. Has 5/5 motor strength in upper and lower extremities.  Lab results:  Recent Labs  05/02/14 1211  NA 142  K 3.9  CL 103  CO2 25  GLUCOSE 105*  BUN 12  CREATININE 0.87  CALCIUM 9.6   No results found for this basename: AST, ALT, ALKPHOS, BILITOT, PROT, ALBUMIN,  in the last 72 hours No results found for this basename: LIPASE, AMYLASE,  in the last 72 hours  Recent Labs  05/02/14 1211  WBC 5.6  NEUTROABS 4.3  HGB 15.5  HCT 45.7  MCV 85.9  PLT 180   No results found for this basename: CKTOTAL, CKMB, CKMBINDEX,  TROPONINI,  in the last 72 hours No components found with this basename: POCBNP,  No results found for this basename: DDIMER,  in the last 72 hours No results found for this basename: HGBA1C,  in the last 72 hours No results found for this basename: CHOL, HDL, LDLCALC, TRIG, CHOLHDL, LDLDIRECT,  in the last 72 hours No results found for this basename: TSH, T4TOTAL, FREET3, T3FREE, THYROIDAB,  in the last 72 hours No results found for this basename: VITAMINB12, FOLATE, FERRITIN, TIBC, IRON, RETICCTPCT,  in the last 72 hours Imaging results:  Dg Chest 2 View  05/02/2014   CLINICAL DATA:  Chest pain for 2 weeks  EXAM: CHEST  2 VIEW  COMPARISON:  04/23/2007  FINDINGS: The cardiac shadow is within normal limits. The lungs are clear bilaterally. No pneumothorax or effusion is seen. Mild degenerative change of the thoracic spine is noted.  IMPRESSION: No active cardiopulmonary disease.   Electronically Signed   By: Inez Catalina M.D.   On: 05/02/2014 12:23   Other results: EKG: Normal sinus rhythm with heart rate of 70.  Assessment & Plan by Problem: Atypical chest pain Admit the patient to telemetry, cycle cardiac enzymes rule the patient out for acute coronary syndrome.  Initial troponin negative.  EKG not concerning for ischemic changes.  Patient has low risk factors.  Case discussed with Dr. Tommi Rumps, cardiology fellow.  If the patient has any further concerning symptoms during the night or if troponins are positive and will need further cardiology evaluation.  If the patient is ruled out for acute coronary syndrome, Dr. Tommi Rumps recommended touching base with cardiology in the morning to determine if patient needs inpatient stress test versus outpatient.  Will have the patient n.p.o. after midnight in the event stress test as planned.  Dr. Tommi Rumps recommended holding off on 2-D echocardiogram at this time.  Continue aspirin.  Hemoglobin A1c and lipid panel evaluated from April of  2015.  Anxiety Continue home medications.  Hyperlipidemia Continue to monitor.  Paresthesias Chronic and stable.  Prophylaxis Lovenox  CODE STATUS Full code.  Disposition Admit the patient to telemetry as observation.  Time spent on admission, talking to the patient, and coordinating care was: 50 mins.  Isaack Preble A, MD 05/02/2014, 8:14 PM

## 2014-05-02 NOTE — Telephone Encounter (Signed)
Called and spoke with patient who stated that he was on his way to [facility] via private vehicle.  Wife is driving.

## 2014-05-02 NOTE — Telephone Encounter (Signed)
Patient Information:  Caller Name: Laine  Phone: [phone]  Patient: Joe Blanchard, Joe Blanchard  Gender: Male  DOB: 02-Oct-1960  Age: 53 Years  PCP: Kathlene November  Office Follow Up:  Does the office need to follow up with this patient?: No  Instructions For The Office: N/A   Symptoms  Reason For Call & Symptoms: Pt has had chest pain x several weeks. It was a nagging pain /mid chest. Last night it became more intense. It seems to follow a large meal that it intensifies. This am it is very painful. Pt has no cardiac history but a strong family history. It seems to be radiating at times down the left arm where his left hand feels numb.  Reviewed Health History In EMR: Yes  Reviewed Medications In EMR: Yes  Reviewed Allergies In EMR: Yes  Reviewed Surgeries / Procedures: Yes  Date of Onset of Symptoms: 04/15/2014  Guideline(s) Used:  Chest Pain  Disposition Per Guideline:   Call EMS 911 Now  Reason For Disposition Reached:   Chest pain lasting longer than 5 minutes and ANY of the following:  Over 28 years old Over 59 years old and at least one cardiac risk factor (i.e., high blood pressure, diabetes, high cholesterol, obesity, smoker or strong family history of heart disease) Pain is crushing, pressure-like, or heavy  Took nitroglycerin and chest pain was not relieved History of heart disease (i.e., angina, heart attack, bypass surgery, angioplasty, CHF)  Advice Given:  N/A  Patient Will Follow Care Advice:  YES

## 2014-05-02 NOTE — Telephone Encounter (Signed)
FYI

## 2014-05-02 NOTE — ED Notes (Signed)
The patient came in becfause he has been having chest pain for two weeks.  He rates his pain 3/10.  The patient does have anxiety and says he "flipperd out" yesterday but says he does not think it is an anxiety attack because he has had it for two weeks.  He is here to be evaluated.  He did say he took a 325mg  Aspirin last night.

## 2014-05-02 NOTE — ED Provider Notes (Signed)
CSN: [id number]     Arrival date & time 05/02/14  1153 History   First MD Initiated Contact with Patient 05/02/14 1622     Chief Complaint  Patient presents with  . Chest Pain    The patient came in becfause he has been having chest pain for two weeks.  He rates his pain 3/10.       (Consider location/radiation/quality/duration/timing/severity/associated sxs/prior Treatment) HPI Comments: Patient presents with chest pain. He described a two-week history of constant pain to the left side of his chest. He states it waxes and wanes in intensity. He has no associated symptoms with the pain. However he has had some exertional worsening of the pain over the last couple of days, most recently yesterday when he was out taking a walk he had to stop and rest. He's had some increased fatigue. He also has 2 episodes since yesterday where he felt like his heart was racing and he became lightheaded and short of breath at that time. He denies any history of heart disease. He has never had a stress test. He has a history of diabetes mellitus which is diet controlled. He also has hyperlipidemia. He has no family history of early heart disease less than 62 years of age.  Patient is a 53 y.o. male presenting with chest pain.  Chest Pain Associated symptoms: fatigue, palpitations and shortness of breath   Associated symptoms: no abdominal pain, no back pain, no cough, no diaphoresis, no dizziness, no fever, no headache, no nausea, no numbness, not vomiting and no weakness     Past Medical History  Diagnosis Date  . Anxiety   . Colon polyps     Cscope 11-11, next 06-2011  . Diabetes mellitus dx 10-12   Past Surgical History  Procedure Laterality Date  . Knee surgery  12/03/08    scope  . Scalp laceration repair  ~09-2013   Family History  Problem Relation Age of Onset  . Colon cancer Father     dx in his 35s  . Lupus Sister     anticoagulant  . Heart disease Other     GF  . Colon polyps Other   .  Prostate cancer Father     dx in his 35s?  . Diabetes Neg Hx    History  Substance Use Topics  . Smoking status: Never Smoker   . Smokeless tobacco: Never Used  . Alcohol Use: 1.8 oz/week    3 Glasses of wine per week     Comment: socially     Review of Systems  Constitutional: Positive for fatigue. Negative for fever, chills and diaphoresis.  HENT: Negative for congestion, rhinorrhea and sneezing.   Eyes: Negative.   Respiratory: Positive for shortness of breath. Negative for cough and chest tightness.   Cardiovascular: Positive for chest pain and palpitations. Negative for leg swelling.  Gastrointestinal: Negative for nausea, vomiting, abdominal pain, diarrhea and blood in stool.  Genitourinary: Negative for frequency, hematuria, flank pain and difficulty urinating.  Musculoskeletal: Negative for arthralgias and back pain.  Skin: Negative for rash.  Neurological: Negative for dizziness, speech difficulty, weakness, numbness and headaches.      Allergies  Review of patient's allergies indicates no known allergies.  Home Medications   Prior to Admission medications   Medication Sig Start Date End Date Taking? Authorizing Provider  acetaminophen (TYLENOL) 500 MG tablet Take 500 mg by mouth every 6 (six) hours as needed.   Yes Historical Provider, MD  ALPRAZolam Duanne Moron)  0.5 MG tablet Take 1-2 tablets (0.5-1 mg total) by mouth at bedtime as needed for sleep. 01/07/14  Yes Colon Branch, MD  FLUoxetine (PROZAC) 20 MG tablet Take 30 mg by mouth daily.   Yes Historical Provider, MD  Omega-3 Fatty Acids (FISH OIL PO) Take 1 capsule by mouth daily.   Yes Historical Provider, MD  zolpidem (AMBIEN) 10 MG tablet Take 10 mg by mouth at bedtime as needed for sleep.   Yes Historical Provider, MD   BP 140/83  Pulse 57  Temp(Src) 98.4 F (36.9 C) (Oral)  Resp 16  SpO2 100% Physical Exam  Constitutional: He is oriented to person, place, and time. He appears well-developed and  well-nourished.  HENT:  Head: Normocephalic and atraumatic.  Eyes: Pupils are equal, round, and reactive to light.  Neck: Normal range of motion. Neck supple.  Cardiovascular: Normal rate, regular rhythm and normal heart sounds.   Pulmonary/Chest: Effort normal and breath sounds normal. No respiratory distress. He has no wheezes. He has no rales. He exhibits no tenderness.  Abdominal: Soft. Bowel sounds are normal. There is no tenderness. There is no rebound and no guarding.  Musculoskeletal: Normal range of motion. He exhibits no edema.  No calf tenderness  Lymphadenopathy:    He has no cervical adenopathy.  Neurological: He is alert and oriented to person, place, and time.  Skin: Skin is warm and dry. No rash noted.  Psychiatric: He has a normal mood and affect.    ED Course  Procedures (including critical care time) Labs Review Results for orders placed during the hospital encounter of 42/59/56  BASIC METABOLIC PANEL      Result Value Ref Range   Sodium 142  137 - 147 mEq/L   Potassium 3.9  3.7 - 5.3 mEq/L   Chloride 103  96 - 112 mEq/L   CO2 25  19 - 32 mEq/L   Glucose, Bld 105 (*) 70 - 99 mg/dL   BUN 12  6 - 23 mg/dL   Creatinine, Ser 0.87  0.50 - 1.35 mg/dL   Calcium 9.6  8.4 - 10.5 mg/dL   GFR calc non Af Amer >90  >90 mL/min   GFR calc Af Amer >90  >90 mL/min   Anion gap 14  5 - 15  CBC WITH DIFFERENTIAL      Result Value Ref Range   WBC 5.6  4.0 - 10.5 K/uL   RBC 5.32  4.22 - 5.81 MIL/uL   Hemoglobin 15.5  13.0 - 17.0 g/dL   HCT 45.7  39.0 - 52.0 %   MCV 85.9  78.0 - 100.0 fL   MCH 29.1  26.0 - 34.0 pg   MCHC 33.9  30.0 - 36.0 g/dL   RDW 12.6  11.5 - 15.5 %   Platelets 180  150 - 400 K/uL   Neutrophils Relative % 78 (*) 43 - 77 %   Neutro Abs 4.3  1.7 - 7.7 K/uL   Lymphocytes Relative 17  12 - 46 %   Lymphs Abs 0.9  0.7 - 4.0 K/uL   Monocytes Relative 5  3 - 12 %   Monocytes Absolute 0.3  0.1 - 1.0 K/uL   Eosinophils Relative 0  0 - 5 %   Eosinophils  Absolute 0.0  0.0 - 0.7 K/uL   Basophils Relative 0  0 - 1 %   Basophils Absolute 0.0  0.0 - 0.1 K/uL  I-STAT TROPOININ, ED      Result  Value Ref Range   Troponin i, poc 0.00  0.00 - 0.08 ng/mL   Comment 3            Dg Chest 2 View  05/02/2014   CLINICAL DATA:  Chest pain for 2 weeks  EXAM: CHEST  2 VIEW  COMPARISON:  04/23/2007  FINDINGS: The cardiac shadow is within normal limits. The lungs are clear bilaterally. No pneumothorax or effusion is seen. Mild degenerative change of the thoracic spine is noted.  IMPRESSION: No active cardiopulmonary disease.   Electronically Signed   By: Inez Catalina M.D.   On: 05/02/2014 12:23      Imaging Review Dg Chest 2 View  05/02/2014   CLINICAL DATA:  Chest pain for 2 weeks  EXAM: CHEST  2 VIEW  COMPARISON:  04/23/2007  FINDINGS: The cardiac shadow is within normal limits. The lungs are clear bilaterally. No pneumothorax or effusion is seen. Mild degenerative change of the thoracic spine is noted.  IMPRESSION: No active cardiopulmonary disease.   Electronically Signed   By: Inez Catalina M.D.   On: 05/02/2014 12:23     EKG Interpretation   Date/Time:  Friday May 02 2014 11:57:57 EDT Ventricular Rate:  70 PR Interval:  142 QRS Duration: 82 QT Interval:  404 QTC Calculation: 436 R Axis:   66 Text Interpretation:  Normal sinus rhythm Normal ECG No old tracing to  compare Confirmed by Acxel Dingee  MD, Hagar Sadiq ([id number]) on 05/02/2014 4:29:42 PM      MDM   Final diagnoses:  Chest pain, unspecified chest pain type    Patient presents with chest pain. His heart score is between 3 and 4. However he has had some exertional symptoms as well which make him a little more concerning. I did consult hospitalist and they will admit the patient for further cardiac evaluation. He was given aspirin in the ED. He was given nitroglycerin as well which does seem to have improved his chest pain.    Malvin Johns, MD 05/02/14 [id number]

## 2014-05-02 NOTE — Telephone Encounter (Signed)
Pt is at ED and they are not taking him back. She has told them that he is having chest pain, but they are not taking him back and he wants to leave AMA.  Wife is worried and needs advice.  [phone].

## 2014-05-03 ENCOUNTER — Other Ambulatory Visit: Payer: Self-pay | Admitting: Nurse Practitioner

## 2014-05-03 ENCOUNTER — Encounter (HOSPITAL_COMMUNITY): Payer: Self-pay | Admitting: Nurse Practitioner

## 2014-05-03 DIAGNOSIS — E119 Type 2 diabetes mellitus without complications: Secondary | ICD-10-CM

## 2014-05-03 DIAGNOSIS — R0789 Other chest pain: Secondary | ICD-10-CM

## 2014-05-03 LAB — BASIC METABOLIC PANEL
Anion gap: 13 (ref 5–15)
BUN: 14 mg/dL (ref 6–23)
CALCIUM: 9.5 mg/dL (ref 8.4–10.5)
CO2: 25 mEq/L (ref 19–32)
Chloride: 103 mEq/L (ref 96–112)
Creatinine, Ser: 0.9 mg/dL (ref 0.50–1.35)
GFR calc Af Amer: >90 mL/min (ref >90)
GLUCOSE: 96 mg/dL (ref 70–99)
Potassium: 4 mEq/L (ref 3.7–5.3)
Sodium: 141 mEq/L (ref 137–147)

## 2014-05-03 LAB — CBC
HEMATOCRIT: 45.2 % (ref 39.0–52.0)
Hemoglobin: 15.2 g/dL (ref 13.0–17.0)
MCH: 29.7 pg (ref 26.0–34.0)
MCHC: 33.6 g/dL (ref 30.0–36.0)
MCV: 88.3 fL (ref 78.0–100.0)
Platelets: 175 10*3/uL (ref 150–400)
RBC: 5.12 MIL/uL (ref 4.22–5.81)
RDW: 12.7 % (ref 11.5–15.5)
WBC: 5.5 10*3/uL (ref 4.0–10.5)

## 2014-05-03 LAB — TROPONIN I
Troponin I: <0.3 ng/mL (ref <0.30)
Troponin I: <0.3 ng/mL (ref <0.30)

## 2014-05-03 MED ORDER — TRAMADOL HCL 50 MG PO TABS: 50.0000 mg | ORAL_TABLET | Freq: Two times a day (BID) | ORAL | Status: DC | PRN

## 2014-05-03 MED ORDER — NITROGLYCERIN 0.4 MG SL SUBL: 0.4000 mg | SUBLINGUAL_TABLET | SUBLINGUAL | Status: DC | PRN

## 2014-05-03 MED ORDER — ASPIRIN EC 81 MG PO TBEC: 81.0000 mg | DELAYED_RELEASE_TABLET | Freq: Every day | ORAL | Status: DC

## 2014-05-03 MED ORDER — PANTOPRAZOLE SODIUM 40 MG PO TBEC: 40.0000 mg | DELAYED_RELEASE_TABLET | Freq: Every day | ORAL | Status: DC

## 2014-05-03 NOTE — Consult Note (Signed)
CARDIOLOGY CONSULT NOTE   Patient ID: Joe Blanchard MRN: [id number], DOB/AGE: 13-Nov-1960   Admit date: 05/02/2014 Date of Consult: 05/03/2014   Primary Physician: Kathlene November, MD Primary Cardiologist:  New to   - seen by M. Adaleah Forget, MD  Pt. Profile  53 y/o male without prior cardiac hx who was admitted to [facility] last night with a 2 wk h/o chest and arm pain.  Problem List  Past Medical History  Diagnosis Date  . Anxiety   . Colon polyps     Cscope 11-11, next 06-2011  . Diabetes mellitus dx 10-12  . Hypertriglyceridemia     a. 11/2013 - 240.    Past Surgical History  Procedure Laterality Date  . Knee surgery  12/03/08    scope  . Scalp laceration repair  ~09-2013     Allergies  No Known Allergies  HPI   53 y/o male w/o prior cardiac hx.  He is a borderline diabetic with prior A1c of 6.6 in 05/2011, now 5.9 - as a result he is not on any meds.  He was in his usoh until about 2 wks ago when he began to experience constant, focal, 3/10, left chest discomfort occurring @ the LLSB.  This is made somewhat better with rubbing the site and otherwise does not change with coughing, deep breathing, eating, or position changes.  He says that when he's up and about @ work, and not thinking about it, he doesn't really notice it.  On two occasions over the past week (once last Saturday and once this past Thursday), both after eating a copious amt of food, he was walking and felt fullness in his epigastric area and chest associated with some reduction in exercise tolerance.  On both occasions, Ss abated with rest.  Despite having exertional Ss on those two occasions, he has been able to push-mow his yard without any symptoms.  Upon the urging of his family, he presented to the ED yesterday.  Here, ECG is non-acute and CE have been negative.  He continues to have mild, focal, LLSB chest pain.  He denies palpitations, dyspnea, pnd, orthopnea, n, v, dizziness, syncope, edema, weight gain, or early  satiety.   Inpatient Medications  . aspirin EC  325 mg Oral Daily  . enoxaparin (LOVENOX) injection  40 mg Subcutaneous Q24H  . FLUoxetine  30 mg Oral Daily  . pantoprazole  40 mg Oral BID  . sodium chloride  3 mL Intravenous Q12H    Family History Family History  Problem Relation Age of Onset  . Colon cancer Father     dx in his 60s  . Lupus Sister     anticoagulant  . Heart disease Other     GF  . Colon polyps Other   . Prostate cancer Father     dx in his 48s?  . Diabetes Neg Hx   . Other Father     died suddenly @ 64.  . Other Mother     alive & well in her 58's.     Social History History   Social History  . Marital Status: Married    Spouse Name: N/A    Number of Children: 2  . 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 Smoking status: Never Smoker   . Smokeless tobacco: Never Used  . Alcohol Use: 1.8 oz/week    3 Glasses of wine per  week     Comment: socially   . Drug Use: No  . Sexual Activity: Yes    Partners: Female   Other Topics Concern  . Not on file   Social History Narrative   Lives w/ wife in [place].  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  Does not routinely exercise.    Review of Systems  General:  No chills, fever, night sweats or weight changes.  Cardiovascular:  +++ chest pain, no dyspnea on exertion, edema, orthopnea, palpitations, paroxysmal nocturnal dyspnea. Dermatological: No rash, lesions/masses Respiratory: No cough, dyspnea Urologic: No hematuria, dysuria Abdominal:   No nausea, vomiting, diarrhea, bright red blood per rectum, melena, or hematemesis Neurologic:  No visual changes, wkns, changes in mental status. Psych: h/o anxiety. All other systems reviewed and are otherwise negative except as noted above.  Physical Exam  Blood pressure 117/67, pulse 60, temperature 98.6 F (37 C), temperature source Oral, resp. rate 16, height 5\' 11"  (1.803 m), weight 203 lb 1.8 oz  (92.131 kg), SpO2 99.00%.  General: Pleasant, NAD Psych: Normal affect. Neuro: Alert and oriented X 3. Moves all extremities spontaneously. HEENT: Normal  Neck: Supple without bruits or JVD. Lungs:  Resp regular and unlabored, CTA. Heart: RRR no s3, s4, or murmurs. Abdomen: Soft, non-tender, non-distended, BS + x 4.  Extremities: No clubbing, cyanosis or edema. DP/PT/Radials 2+ and equal bilaterally.  Labs   Recent Labs  05/02/14 2057 05/03/14 0331 05/03/14 0805  TROPONINI <0.30 <0.30 <0.30   Lab Results  Component Value Date   WBC 5.5 05/03/2014   HGB 15.2 05/03/2014   HCT 45.2 05/03/2014   MCV 88.3 05/03/2014   PLT 175 05/03/2014     Recent Labs Lab 05/03/14 0331  NA 141  K 4.0  CL 103  CO2 25  BUN 14  CREATININE 0.90  CALCIUM 9.5  GLUCOSE 96   Lab Results  Component Value Date   CHOL 197 11/20/2013   HDL 38.40* 11/20/2013   LDLCALC 111* 11/20/2013   TRIG 240.0* 11/20/2013   Radiology/Studies  Dg Chest 2 View  05/02/2014   CLINICAL DATA:  Chest pain for 2 weeks  EXAM: CHEST  2 VIEW  COMPARISON:  04/23/2007  FINDINGS: The cardiac shadow is within normal limits. The lungs are clear bilaterally. No pneumothorax or effusion is seen. Mild degenerative change of the thoracic spine is noted.  IMPRESSION: No active cardiopulmonary disease.   Electronically Signed   By: Inez Catalina M.D.   On: 05/02/2014 12:23   ECG  RSR, 70, no acute ST/T changes.  ASSESSMENT AND PLAN  1.  Midsternal Chest Pain:  Pt presents with a 2 wk h/o mild, focal chest pain @ the LLSB.  This has been constant and relatively unchanged, or potentially better, with exertion.  He's also had two episodes of exertional chest fullness associated with reduced exercise tolerance following large meals.  Despite Ss, he has no objective evidence of ischemia.  We will arrange for outpt exercise myoview in our office and subsequent cardiology f/u.  Cont low dose ASA and PPI.  He is on fish oil @ home for  HTG.  Signed, Murray Hodgkins, NP 05/03/2014, 10:12 AM   See also my note  Sanda Klein, MD, [facility] [phone] office [phone] pager

## 2014-05-03 NOTE — Progress Notes (Signed)
Utilization Review Completed.Joe Blanchard T9/19/2015  

## 2014-05-03 NOTE — Discharge Summary (Signed)
Physician Discharge Summary  Patient ID: Joe Blanchard MRN: [id number] DOB/AGE: 01/18/1961 53 y.o.  Admit date: 05/02/2014 Discharge date: 05/03/2014  Primary Care Physician:  Kathlene November, MD  Discharge Diagnoses:    . Chest pain, atypical  . Anxiety . Dyslipidemia . Paresthesia . Diabetes mellitus  Consults: Cardiology, Dr. Sallyanne Kuster   Recommendations for Outpatient Follow-up:  Patient was recommended by cardiology to have outpatient stress test, which will be arranged by [facility] heart care office  Allergies:  No Known Allergies   Discharge Medications:   Medication List         acetaminophen 500 MG tablet  Commonly known as:  TYLENOL  Take 500 mg by mouth every 6 (six) hours as needed.     ALPRAZolam 0.5 MG tablet  Commonly known as:  XANAX  Take 1-2 tablets (0.5-1 mg total) by mouth at bedtime as needed for sleep.     aspirin EC 81 MG tablet  Take 1 tablet (81 mg total) by mouth daily.     FISH OIL PO  Take 1 capsule by mouth daily.     FLUoxetine 20 MG tablet  Commonly known as:  PROZAC  Take 30 mg by mouth daily.     nitroGLYCERIN 0.4 MG SL tablet  Commonly known as:  NITROSTAT  Place 1 tablet (0.4 mg total) under the tongue every 5 (five) minutes as needed for chest pain (CP or SOB).     pantoprazole 40 MG tablet  Commonly known as:  PROTONIX  Take 1 tablet (40 mg total) by mouth daily.     traMADol 50 MG tablet  Commonly known as:  ULTRAM  Take 1 tablet (50 mg total) by mouth every 12 (twelve) hours as needed for severe pain.     zolpidem 10 MG tablet  Commonly known as:  AMBIEN  Take 10 mg by mouth at bedtime as needed for sleep.         Brief H and P: For complete details please refer to admission H and P, but in brief Joe Blanchard is a 53 y.o. Caucasian male with history of anxiety and diabetes in the past which is well controlled and currently not on any diabetic medications who presented with chest pain. Patient indicated that over  the last 2 weeks he has had constant left-sided midsternal chest pain. He indicated that with activity he forgets about the chest pain. Last night after dinner, unfortunately he had an episode which lasted 1 minute where he felt dizzy, lightheaded, diaphoretic, and flushed. He contacted his primary care physician who instructed him to the emergency department for further evaluation. He denied any fevers, or chills, nausea, vomiting, shortness of breath, diarrhea, headaches or vision changes  Hospital Course:  Chest pain: Atypical likely due to GERD or anxiety, no prior cardiac workup, patient reported off-and-on episodes of chest pain in the last 2 weeks, working activity makes it better. Troponins x3 remained negative, EKG did not show acute ST-T wave changes chest of ischemia. Cardiology consult was obtained. Per cardiology he does not appear to have objective evidence of ischemia and hands will arrange for outpatient exercise Myoview in the office and subsequently cardiology followup. Until then continue low-dose aspirin and PPI.   Anxiety -Continue Xanax and fluoxetine   Diabetes mellitus  - Patient reports diet-controlled diabetes mellitus, placed on carb modified/heart healthy diet while inpatient  Paresthesia  - Stable       Day of Discharge BP 117/67  Pulse 60  Temp(Src) 98.6 F (37 C) (Oral)  Resp 16  Ht 5\' 11"  (1.803 m)  Wt 92.131 kg (203 lb 1.8 oz)  BMI 28.34 kg/m2  SpO2 99%  Physical Exam: General: Alert and awake oriented x3 not in any acute distress. HEENT: anicteric sclera, pupils reactive to light and accommodation CVS: S1-S2 clear no murmur rubs or gallops Chest: clear to auscultation bilaterally, no wheezing rales or rhonchi Abdomen: soft nontender, nondistended, normal bowel sounds Extremities: no cyanosis, clubbing or edema noted bilaterally Neuro: Cranial nerves II-XII intact, no focal neurological deficits   The results of significant diagnostics from this  hospitalization (including imaging, microbiology, ancillary and laboratory) are listed below for reference.    LAB RESULTS: Basic Metabolic Panel:  Recent Labs Lab 05/02/14 1211 05/03/14 0331  NA 142 141  K 3.9 4.0  CL 103 103  CO2 25 25  GLUCOSE 105* 96  BUN 12 14  CREATININE 0.87 0.90  CALCIUM 9.6 9.5   Liver Function Tests: No results found for this basename: AST, ALT, ALKPHOS, BILITOT, PROT, ALBUMIN,  in the last 168 hours No results found for this basename: LIPASE, AMYLASE,  in the last 168 hours No results found for this basename: AMMONIA,  in the last 168 hours CBC:  Recent Labs Lab 05/02/14 1211 05/03/14 0331  WBC 5.6 5.5  NEUTROABS 4.3  --   HGB 15.5 15.2  HCT 45.7 45.2  MCV 85.9 88.3  PLT 180 175   Cardiac Enzymes:  Recent Labs Lab 05/03/14 0331 05/03/14 0805  TROPONINI <0.30 <0.30   BNP: No components found with this basename: POCBNP,  CBG: No results found for this basename: GLUCAP,  in the last 168 hours  Significant Diagnostic Studies:  Dg Chest 2 View  05/02/2014   CLINICAL DATA:  Chest pain for 2 weeks  EXAM: CHEST  2 VIEW  COMPARISON:  04/23/2007  FINDINGS: The cardiac shadow is within normal limits. The lungs are clear bilaterally. No pneumothorax or effusion is seen. Mild degenerative change of the thoracic spine is noted.  IMPRESSION: No active cardiopulmonary disease.   Electronically Signed   By: Inez Catalina M.D.   On: 05/02/2014 12:23       Disposition and Follow-up:     Discharge Instructions   Diet - low sodium heart healthy    Complete by:  As directed      Discharge instructions    Complete by:  As directed   Cardiology office will arrange outpatient stress test, please call on Monday to confirm the appointments.     Increase activity slowly    Complete by:  As directed             DISPOSITION: Home  DIET: Carb modified diet    DISCHARGE FOLLOW-UP Follow-up Information   Follow up with Sanda Klein, MD. Call  on 05/05/2014. (Please confirm on Monday regarding the instructions and appointment for outpatient stress test)    Specialty:  Cardiology   Contact information:   [address] [place] [address] [phone]       Follow up with Kathlene November, MD. Schedule an appointment as soon as possible for a visit in 2 weeks. (for hospital follow-up)    Specialty:  Internal Medicine   Contact information:   [place] [address] [place] [address] [phone]       Time spent on Discharge: 35 minutes  Signed:   Adrean Heitz M.D. [facility] 05/03/2014, 11:28 AM Pager: [phone]   **  Disclaimer: This note was dictated with voice recognition software. Similar sounding words can inadvertently be transcribed and this note may contain transcription errors which may not have been corrected upon publication of note.**

## 2014-05-03 NOTE — Progress Notes (Addendum)
Patient ID: FARHAAN MABEE  male  JSE:[id number]    DOB: Nov 07, 1960    DOA: 05/02/2014  PCP: Kathlene November, MD  Assessment/Plan: Principal Problem:   Chest pain; atypical likely due to GERD or anxiety, no prior cardiac workup, patient reports off-and-on episodes of chest pain in the last 2 weeks, working activity makes it better -  Troponins x3 negative, EKG did not show acute ST-T wave changes chest of ischemia, called cardiology consult. Unfortunately no spots are available for inpatient nuclear medicine stress test. We'll await for cardiology recommendations.  - Continue PPI  Active Problems:   Anxiety -Continue Xanax and fluoxetine     Diabetes mellitus - Patient reports diet-controlled diabetes mellitus, placed on carb modified/heart healthy diet    Paresthesia - Stable   DVT Prophylaxis:Lovenox   Code Status: full code  Family Communication:Discussed with patient and his wife at the bedside   [place] cardiology recommendations   Consultants:  Cardiology   Procedures:  None   Antibiotics:  None    Subjective: Patient seen and examined, no chest pain at the time of my encounter, no nausea, vomiting, shortness of breath, coughing or fevers or chills   Objective: Weight change:  No intake or output data in the 24 hours ending 05/03/14 0948 Blood pressure 117/67, pulse 60, temperature 98.6 F (37 C), temperature source Oral, resp. rate 16, height 5\' 11"  (1.803 m), weight 92.131 kg (203 lb 1.8 oz), SpO2 99.00%.  Physical Exam: General: Alert and awake, oriented x3, not in any acute distress. HEENT: anicteric sclera, PERLA, EOMI CVS: S1-S2 clear, no murmur rubs or gallops Chest: clear to auscultation bilaterally, no wheezing, rales or rhonchi Abdomen: soft nontender, nondistended, normal bowel sounds  Extremities: no cyanosis, clubbing or edema noted bilaterally Neuro: Cranial nerves II-XII intact, no focal neurological deficits  Lab  Results: Basic Metabolic Panel:  Recent Labs Lab 05/02/14 1211 05/03/14 0331  NA 142 141  K 3.9 4.0  CL 103 103  CO2 25 25  GLUCOSE 105* 96  BUN 12 14  CREATININE 0.87 0.90  CALCIUM 9.6 9.5   Liver Function Tests: No results found for this basename: AST, ALT, ALKPHOS, BILITOT, PROT, ALBUMIN,  in the last 168 hours No results found for this basename: LIPASE, AMYLASE,  in the last 168 hours No results found for this basename: AMMONIA,  in the last 168 hours CBC:  Recent Labs Lab 05/02/14 1211 05/03/14 0331  WBC 5.6 5.5  NEUTROABS 4.3  --   HGB 15.5 15.2  HCT 45.7 45.2  MCV 85.9 88.3  PLT 180 175   Cardiac Enzymes:  Recent Labs Lab 05/02/14 2057 05/03/14 0331 05/03/14 0805  TROPONINI <0.30 <0.30 <0.30   BNP: No components found with this basename: POCBNP,  CBG: No results found for this basename: GLUCAP,  in the last 168 hours   Micro Results: No results found for this or any previous visit (from the past 240 hour(s)).  Studies/Results: Dg Chest 2 View  05/02/2014   CLINICAL DATA:  Chest pain for 2 weeks  EXAM: CHEST  2 VIEW  COMPARISON:  04/23/2007  FINDINGS: The cardiac shadow is within normal limits. The lungs are clear bilaterally. No pneumothorax or effusion is seen. Mild degenerative change of the thoracic spine is noted.  IMPRESSION: No active cardiopulmonary disease.   Electronically Signed   By: Inez Catalina M.D.   On: 05/02/2014 12:23    Medications: Scheduled Meds: . aspirin EC  325 mg Oral Daily  .  enoxaparin (LOVENOX) injection  40 mg Subcutaneous Q24H  . FLUoxetine  30 mg Oral Daily  . pantoprazole  40 mg Oral BID  . sodium chloride  3 mL Intravenous Q12H      LOS: 1 day   Chimere Klingensmith M.D. [facility] 05/03/2014, 9:48 AM Pager: [phone]  If 7PM-7AM, please contact night-coverage [email or web address] Password [email or web address]

## 2014-05-03 NOTE — Progress Notes (Signed)
I have seen and examined the patient along with Ignacia Bayley, NP.  I have reviewed the chart, notes and new data.  I agree with NP's note.  Key new complaints: chest discomfort usually at rest, often after meals (on one occasion walking fast after a meal), none while using a pushmower Key examination changes: no abnormalities on cardiac exam Key new findings / data: benign ECg and biochemical markers.   PLAN: Outpatient treadmill stress test and followup  Sanda Klein, MD, [place] [phone] 05/03/2014, 9:52 AM

## 2014-05-05 ENCOUNTER — Telehealth: Payer: Self-pay

## 2014-05-05 MED ORDER — ALPRAZOLAM 0.5 MG PO TABS: 0.5000 mg | ORAL_TABLET | Freq: Every evening | ORAL | Status: DC | PRN

## 2014-05-05 NOTE — Telephone Encounter (Signed)
Pt is requesting refill on Alprazolam.  Last OV: 11/30/2013 Last Fill: 01/07/2014 # 60 with 3 RF UDS: 06/07/2013 Low Risk   Please advise.

## 2014-05-05 NOTE — Telephone Encounter (Signed)
Prescription printed, he is due  for a six-month checkup, please arrange

## 2014-05-05 NOTE — Telephone Encounter (Signed)
Faxed to [facility].

## 2014-05-09 ENCOUNTER — Telehealth: Payer: Self-pay | Admitting: Cardiovascular Disease

## 2014-05-09 NOTE — Telephone Encounter (Signed)
Closed encounter °

## 2014-05-13 ENCOUNTER — Telehealth (HOSPITAL_COMMUNITY): Payer: Self-pay

## 2014-05-13 NOTE — Telephone Encounter (Signed)
Encounter complete. 

## 2014-05-14 ENCOUNTER — Telehealth (HOSPITAL_COMMUNITY): Payer: Self-pay

## 2014-05-14 NOTE — Telephone Encounter (Signed)
Encounter complete. 

## 2014-05-15 ENCOUNTER — Ambulatory Visit (HOSPITAL_COMMUNITY)
Admission: RE | Admit: 2014-05-15 | Discharge: 2014-05-15 | Disposition: A | Discharge status: Home / Self Care | Payer: 59 | Source: Ambulatory Visit | Attending: Cardiology | Admitting: Cardiology

## 2014-05-15 DIAGNOSIS — E119 Type 2 diabetes mellitus without complications: Secondary | ICD-10-CM | POA: Diagnosis not present

## 2014-05-15 DIAGNOSIS — R079 Chest pain, unspecified: Secondary | ICD-10-CM | POA: Diagnosis not present

## 2014-05-15 DIAGNOSIS — E785 Hyperlipidemia, unspecified: Secondary | ICD-10-CM | POA: Diagnosis not present

## 2014-05-15 DIAGNOSIS — R002 Palpitations: Secondary | ICD-10-CM | POA: Insufficient documentation

## 2014-05-15 DIAGNOSIS — R0789 Other chest pain: Secondary | ICD-10-CM

## 2014-05-15 DIAGNOSIS — Z8249 Family history of ischemic heart disease and other diseases of the circulatory system: Secondary | ICD-10-CM | POA: Insufficient documentation

## 2014-05-15 DIAGNOSIS — R42 Dizziness and giddiness: Secondary | ICD-10-CM | POA: Diagnosis not present

## 2014-05-15 DIAGNOSIS — R072 Precordial pain: Secondary | ICD-10-CM

## 2014-05-15 MED ORDER — TECHNETIUM TC 99M SESTAMIBI GENERIC - CARDIOLITE
30.0000 | Freq: Once | INTRAVENOUS | Status: AC | PRN
  Administered 2014-05-15: 30 via INTRAVENOUS

## 2014-05-15 MED ORDER — TECHNETIUM TC 99M SESTAMIBI GENERIC - CARDIOLITE
10.0000 | Freq: Once | INTRAVENOUS | Status: AC | PRN
  Administered 2014-05-15: 10 via INTRAVENOUS

## 2014-05-15 NOTE — Procedures (Addendum)
[place] [address] [place] [address] [phone]  Cardiology Nuclear Med Andrews Tener Akkerman is a 53 y.o. male     MRN : [id number]     DOB: 12-10-1960  Procedure Date: 05/15/2014  Nuclear Med Background Indication for Stress Test:  Evaluation for Ischemia and [facility] History:  No prior cardiac or respiratory history reported;No prior NUC MPI for comparison. Cardiac Risk Factors: Family History - CAD, Lipids and NIDDM  Symptoms:  Chest Pain, Light-Headedness and Palpitations   Nuclear Pre-Procedure Caffeine/Decaff Intake:  7:00pm NPO After: 5:00am   IV Site: R Forearm  IV 0.9% NS with Angio Cath:  22g  Chest Size (in):  48"  IV Started by: Rolene Course, RN  Height: 5\' 11"  (1.803 m)  Cup Size: n/a  BMI:  Body mass index is 28.33 kg/(m^2). Weight:  203 lb (92.08 kg)   Tech Comments:  n/a    Nuclear Med Study 1 or 2 day study: 1 day  Stress Test Type:  Stress  Order Authorizing Provider:  Sanda Klein, MD   Resting Radionuclide: Technetium 21m Sestamibi  Resting Radionuclide Dose: 10.5 mCi   Stress Radionuclide:  Technetium 64m Sestamibi  Stress Radionuclide Dose: 30.2 mCi           Stress Protocol Rest HR: 64 Stress HR: 166  Rest BP: 124/88 Stress BP: 153/86  Exercise Time (min): 8 METS: 10.1   Predicted Max HR: 167 bpm % Max HR: 99.4 bpm Rate Pressure Product: 25398  Dose of Adenosine (mg):  n/a Dose of Lexiscan: n/a mg  Dose of Atropine (mg): n/a Dose of Dobutamine: n/a mcg/kg/min (at max HR)  Stress Test Technologist: Leane Para, CCT Nuclear Technologist: Otho Perl, CNMT   Rest Procedure:  Myocardial perfusion imaging was performed at rest 45 minutes following the intravenous administration of Technetium 2m Sestamibi. Stress Procedure:  The patient performed treadmill exercise using a Bruce  Protocol for 8 minutes. The patient stopped due to SOB and denied any chest pain.   There were no significant ST-T wave changes.  Technetium 37m Sestamibi was injected IV at peak exercise and myocardial perfusion imaging was performed after a brief delay.  Transient Ischemic Dilatation (Normal <1.22):  1.07 QGS EDV:  114 ml QGS ESV:  53 ml LV Ejection Fraction: 53%       Rest ECG: NSR - Normal sinus rhythm, early repolarization abnormality.  Stress ECG: No significant ST segment change suggestive of ischemia.  QPS Raw Data Images:  Acquisition technically good; mild LVE. Stress Images:  Normal homogeneous uptake in all areas of the myocardium. Rest Images:  Normal homogeneous uptake in all areas of the myocardium. Subtraction (SDS):  No evidence of ischemia.  Impression Exercise Capacity:  Fair exercise capacity. BP Response:  Normal blood pressure response. Clinical Symptoms:  There is dyspnea. ECG Impression:  No significant ST segment change suggestive of ischemia. Comparison with Prior Nuclear Study: No previous nuclear study performed  Overall Impression:  Normal stress nuclear study.  LV Wall Motion:  NL LV Function; NL Wall Motion   Kirk Ruths, MD  05/15/2014 12:21 PM

## 2014-05-22 ENCOUNTER — Encounter: Payer: Self-pay | Admitting: Internal Medicine

## 2014-05-22 ENCOUNTER — Ambulatory Visit (INDEPENDENT_AMBULATORY_CARE_PROVIDER_SITE_OTHER): Payer: 59 | Admitting: Internal Medicine

## 2014-05-22 VITALS — BP 126/85 | HR 61 | Temp 98.3°F | Wt 206.4 lb

## 2014-05-22 DIAGNOSIS — F419 Anxiety disorder, unspecified: Secondary | ICD-10-CM

## 2014-05-22 DIAGNOSIS — R0782 Intercostal pain: Secondary | ICD-10-CM

## 2014-05-22 DIAGNOSIS — E119 Type 2 diabetes mellitus without complications: Secondary | ICD-10-CM

## 2014-05-22 DIAGNOSIS — E785 Hyperlipidemia, unspecified: Secondary | ICD-10-CM

## 2014-05-22 DIAGNOSIS — E291 Testicular hypofunction: Secondary | ICD-10-CM

## 2014-05-22 DIAGNOSIS — Z23 Encounter for immunization: Secondary | ICD-10-CM

## 2014-05-22 LAB — LIPID PANEL
CHOL/HDL RATIO: 5
Cholesterol: 213 mg/dL — ABNORMAL HIGH (ref 0–200)
HDL: 40.7 mg/dL (ref >39.00)
LDL CALC: 137 mg/dL — AB (ref 0–99)
NonHDL: 172.3
TRIGLYCERIDES: 179 mg/dL — AB (ref 0.0–149.0)
VLDL: 35.8 mg/dL (ref 0.0–40.0)

## 2014-05-22 LAB — HEMOGLOBIN A1C: HEMOGLOBIN A1C: 5.7 % (ref 4.6–6.5)

## 2014-05-22 MED ORDER — FLUOXETINE HCL 60 MG PO TABS: 60.0000 mg | ORAL_TABLET | Freq: Every day | ORAL | Status: DC

## 2014-05-22 NOTE — Progress Notes (Signed)
Subjective:    Patient ID: Joe Blanchard, male    DOB: 1961-02-21, 53 y.o.   MRN: [id number]  DOS:  05/22/2014 Type of visit - description : f/u Interval history: Was admitted overnight to the hospital last month for chest pain. Has on and off chest pain, at the  left chest, always in the same area, intensity is 2/10 on "only when I think about it" no associated nausea, diaphoresis. No exertional, actually he feels well when he is active. Stress test was negative.  Also on Prozac, anxiety is well controlled but he feels flat, " not excited about  Anything, like I don't care", thinks is related to medications;  Fatigue --> at baseline. History of low testosterone, on no  medications at this time.     Past Medical History  Diagnosis Date  . Anxiety   . Colon polyps     Cscope 11-11, next 06-2011  . Diabetes mellitus dx 10-12  . Hypertriglyceridemia     a. 11/2013 - 240.    Past Surgical History  Procedure Laterality Date  . Knee surgery  12/03/08    scope  . Scalp laceration repair  ~09-2013    History   Social History  . Marital Status: Married    Spouse Name: N/A    Number of Children: 2  . 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 Smoking status: Never Smoker   . Smokeless tobacco: Never Used  . Alcohol Use: 1.8 oz/week    3 Glasses of wine per week     Comment: socially   . Drug Use: No  . Sexual Activity: Yes    Partners: Female   Other Topics Concern  . Not on file   Social History Narrative   Lives w/ wife in [place].  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  Does not routinely exercise.        Medication List       This list is accurate as of: 05/22/14  5:17 PM.  Always use your most recent med list.               acetaminophen 500 MG tablet  Commonly known as:  TYLENOL  Take 500 mg by mouth every 6 (six) hours as needed.     ALPRAZolam 0.5 MG tablet  Commonly known as:  XANAX  Take  1-2 tablets (0.5-1 mg total) by mouth at bedtime as needed for sleep.     aspirin EC 81 MG tablet  Take 1 tablet (81 mg total) by mouth daily.     FISH OIL PO  Take 1 capsule by mouth daily.     FLUoxetine HCl 60 MG Tabs  Take 60 mg by mouth daily.     pantoprazole 40 MG tablet  Commonly known as:  PROTONIX  Take 1 tablet (40 mg total) by mouth daily.     zolpidem 10 MG tablet  Commonly known as:  AMBIEN  Take 10 mg by mouth at bedtime as needed for sleep.           Objective:   Physical Exam BP 126/85  Pulse 61  Temp(Src) 98.3 F (36.8 C) (Oral)  Wt 206 lb 6 oz (93.611 kg)  SpO2 98%  General -- alert, well-developed, NAD.   Extremities-- no pretibial edema bilaterally  Neurologic--  alert & oriented X3. Speech normal, gait appropriate for age, strength symmetric and appropriate for  age.   Psych-- Cognition and judgment appear intact. Cooperative with normal attention span and concentration. slt  anxious but no depressed appearing.       Assessment & Plan:     Today , I spent more than  32  min with the patient: >50% of the time counseling regards  Anxiety treatment options and counseling

## 2014-05-22 NOTE — Assessment & Plan Note (Signed)
Labs,Patient is still not convinced he has diabetes

## 2014-05-22 NOTE — Assessment & Plan Note (Signed)
Chart reviewed, chest pain is atypical, stress test negative few days ago. Recommend observation, okay to cancel Cards appointment, patient states he will

## 2014-05-22 NOTE — Assessment & Plan Note (Addendum)
Extensive discussion about this issue, chart is reviewed, his problem has always been anxiety, no depression. Citalopram was discontinued due to fatigue, on fluoxetine 30 mg, symptoms well-controlled but he feels un-excited and that bothers him. Options:  Wellbutrin, change to Cymbalta, increase fluoxetine dose. Elected to increase fluoxetine dose,  follow up in 6 weeks, see instructions

## 2014-05-22 NOTE — Assessment & Plan Note (Addendum)
Took clomiphene for a year, testosterone normalized, he did not feel any different physically or emotionally and decided to discontinue clomiphene

## 2014-05-22 NOTE — Patient Instructions (Signed)
Fluoxetine 20 mg: 2 tabs a day x 2 weeks Then start fluoxetine 60 mg: 1 tab a day  Next visit in 2 months

## 2014-05-22 NOTE — Progress Notes (Signed)
Pre visit review using our clinic review tool, if applicable. No additional management support is needed unless otherwise documented below in the visit note. 

## 2014-05-22 NOTE — Assessment & Plan Note (Signed)
Labs

## 2014-05-26 ENCOUNTER — Ambulatory Visit: Payer: 59 | Admitting: Cardiovascular Disease

## 2014-05-28 ENCOUNTER — Telehealth: Payer: Self-pay

## 2014-05-28 NOTE — Telephone Encounter (Signed)
UDS: 05/22/2014   Positive for Ambien Negative for Xanax Positive for Prozac  Negative for Tramadol  Low risk per Dr. Larose Kells 05/28/2014

## 2014-06-03 ENCOUNTER — Encounter: Payer: Self-pay | Admitting: Internal Medicine

## 2014-07-07 ENCOUNTER — Telehealth: Payer: Self-pay

## 2014-07-07 MED ORDER — ALPRAZOLAM 0.5 MG PO TABS: 0.5000 mg | ORAL_TABLET | Freq: Every evening | ORAL | Status: DC | PRN

## 2014-07-07 NOTE — Telephone Encounter (Signed)
Pt is requesting refill on Alprazolam  Last OV: 05/22/2014 Last Fill: 05/05/2014 # 60 1RF UDS: 05/22/2014 Low risk  Please advise.

## 2014-07-07 NOTE — Telephone Encounter (Signed)
Rx printed, awaiting signature by Dr. Paz when he returns to office on 11/24.  

## 2014-07-07 NOTE — Telephone Encounter (Signed)
Please print a Rx  #60, 1 RF, I'll sign it

## 2014-07-08 NOTE — Telephone Encounter (Signed)
[facility]

## 2014-07-14 ENCOUNTER — Other Ambulatory Visit: Payer: Self-pay

## 2014-07-30 ENCOUNTER — Ambulatory Visit: Payer: 59 | Admitting: Internal Medicine

## 2014-08-06 ENCOUNTER — Telehealth: Payer: Self-pay

## 2014-08-06 MED ORDER — ZOLPIDEM TARTRATE 10 MG PO TABS: 10.0000 mg | ORAL_TABLET | Freq: Every evening | ORAL | Status: DC | PRN

## 2014-08-06 NOTE — Telephone Encounter (Signed)
done

## 2014-08-06 NOTE — Telephone Encounter (Signed)
Pt is requesting refill on Ambien.  Last OV: 05/22/2014 Last Fill: 04/07/2014 # 30 3RF UDS: 05/22/2014 Low risk  Please advise.

## 2014-08-06 NOTE — Telephone Encounter (Signed)
[facility]

## 2014-08-18 ENCOUNTER — Ambulatory Visit (INDEPENDENT_AMBULATORY_CARE_PROVIDER_SITE_OTHER): Payer: 59 | Admitting: Internal Medicine

## 2014-08-18 ENCOUNTER — Encounter: Payer: Self-pay | Admitting: Internal Medicine

## 2014-08-18 VITALS — BP 134/89 | HR 63 | Temp 98.2°F | Ht 71.0 in | Wt 204.2 lb

## 2014-08-18 DIAGNOSIS — F419 Anxiety disorder, unspecified: Secondary | ICD-10-CM

## 2014-08-18 MED ORDER — ALPRAZOLAM 0.5 MG PO TABS: 0.5000 mg | ORAL_TABLET | Freq: Every evening | ORAL | Status: DC | PRN

## 2014-08-18 MED ORDER — FLUOXETINE HCL 20 MG PO TABS: ORAL_TABLET | ORAL | Status: DC

## 2014-08-18 MED ORDER — DULOXETINE HCL 30 MG PO CPEP: ORAL_CAPSULE | ORAL | Status: DC

## 2014-08-18 NOTE — Progress Notes (Signed)
   Subjective:    Patient ID: Joe Blanchard, male    DOB: April 26, 1961, 54 y.o.   MRN: [id number]  DOS:  08/18/2014 Type of visit - description : Follow-up Interval history: Follow-up from previous visit, feeling about the same. See assessment and plan    Past Medical History  Diagnosis Date  . Anxiety   . Colon polyps     Cscope 11-11, next 06-2011  . Diabetes mellitus dx 10-12  . Hypertriglyceridemia     a. 11/2013 - 240.    Past Surgical History  Procedure Laterality Date  . Knee surgery  12/03/08    scope  . Scalp laceration repair  ~09-2013    History   Social History  . Marital Status: Married    Spouse Name: N/A    Number of Children: 2  . 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 Smoking status: Never Smoker   . Smokeless tobacco: Never Used  . Alcohol Use: 1.8 oz/week    3 Glasses of wine per week     Comment: socially   . Drug Use: No  . Sexual Activity:    Partners: Female   Other Topics Concern  . Not on file   Social History Narrative   Lives w/ wife in [place].  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  Does not routinely exercise.        Medication List       This list is accurate as of: 08/18/14  3:38 PM.  Always use your most recent med list.               acetaminophen 500 MG tablet  Commonly known as:  TYLENOL  Take 500 mg by mouth every 6 (six) hours as needed.     ALPRAZolam 0.5 MG tablet  Commonly known as:  XANAX  Take 1-2 tablets (0.5-1 mg total) by mouth at bedtime as needed for sleep.     aspirin EC 81 MG tablet  Take 1 tablet (81 mg total) by mouth daily.     DULoxetine 30 MG capsule  Commonly known as:  CYMBALTA  1 tab a day x 3 weeks , then 2 tabs a day     FISH OIL PO  Take 1 capsule by mouth daily.     FLUoxetine 20 MG tablet  Commonly known as:  PROZAC  1 tab a day for 3 weeks then stop     pantoprazole 40 MG tablet  Commonly known as:  PROTONIX  Take 1  tablet (40 mg total) by mouth daily.     zolpidem 10 MG tablet  Commonly known as:  AMBIEN  Take 1 tablet (10 mg total) by mouth at bedtime as needed for sleep.           Objective:   Physical Exam BP 134/89 mmHg  Pulse 63  Temp(Src) 98.2 F (36.8 C) (Oral)  Ht 5\' 11"  (1.803 m)  Wt 204 lb 4 oz (92.647 kg)  BMI 28.50 kg/m2  SpO2 93%  General -- alert, well-developed, NAD.  Psych-- Cognition and judgment appear intact. Cooperative with normal attention span and concentration. No anxious or depressed appearing.      Assessment & Plan:  Today , I spent more than 18   min with the patient: >50% of the time counseling regards treatment options, see a/p

## 2014-08-18 NOTE — Progress Notes (Signed)
Pre visit review using our clinic review tool, if applicable. No additional management support is needed unless otherwise documented below in the visit note. 

## 2014-08-18 NOTE — Patient Instructions (Signed)
For the next 3 weeks take both: Cymbalta 30 mg one tablet daily Fluoxetine 20 mg one tablet daily  After 3 weeks, take Cymbalta 30 mg 2 tablets a day and stop fluoxetine  Next visit 2 months  Consider see one of our counselors

## 2014-08-19 ENCOUNTER — Telehealth: Payer: Self-pay | Admitting: *Deleted

## 2014-08-19 NOTE — Assessment & Plan Note (Signed)
Since the last visit, he increased Prozac, feeling about the same, anxiety well controlled but sometimes feels like he doesn't care much about things. We again discussed  options: Change to Cymbalta, see a psychiatrist, see a counselor. He definitely recognizes the medications helps with anxiety, we simply need to cope with the side effects recognizing that part of the issue could be his personality (simply how he is). At the end he elected to switch to Cymbalta;  I strongly recommend he also sees a Social worker. See instructions, follow-up in few weeks

## 2014-08-19 NOTE — Telephone Encounter (Signed)
Prior authorization for duloxetine initiated. Awaiting determination. JG//CMA

## 2014-08-20 NOTE — Telephone Encounter (Signed)
PA approved through 08/19/2015.

## 2014-09-11 ENCOUNTER — Other Ambulatory Visit: Payer: Self-pay | Admitting: Internal Medicine

## 2014-10-01 ENCOUNTER — Telehealth: Payer: Self-pay | Admitting: Internal Medicine

## 2014-10-01 NOTE — Telephone Encounter (Signed)
It has been hard to get him regulated, if he can afford to stay on Cymbalta that would be my preference. If he cannot, okay to switch to Effexor XR  75 two  tablets daily or Effexor XR 150 mg one tablet daily.

## 2014-10-01 NOTE — Telephone Encounter (Signed)
I checked formulary online and venlafaxine 75 mg is a Tier 1 drug. Please advise if change is appropriate? JG//CMA

## 2014-10-01 NOTE — Telephone Encounter (Signed)
Caller name: ashaun Relation to pt: self Call back number: [phone] Pharmacy: [address]  Reason for call:   Patient states that he went to fill duloxetine and this med will cost him $ 80. Is there an alternative that he can take? He states that he doesn't really want to switch to something else because he likes the results of this med. [employer] is telling him that a med that start with a V is on tier 1.

## 2014-10-02 NOTE — Telephone Encounter (Signed)
Called and left message for patient to please return call. JG//CMA

## 2014-10-06 ENCOUNTER — Other Ambulatory Visit: Payer: Self-pay | Admitting: Internal Medicine

## 2014-10-06 ENCOUNTER — Encounter: Payer: Self-pay | Admitting: Internal Medicine

## 2014-10-06 NOTE — Telephone Encounter (Signed)
Faxed to [facility].

## 2014-10-06 NOTE — Telephone Encounter (Signed)
Rx printed, awaiting signature by Dr. Paz.  

## 2014-10-06 NOTE — Telephone Encounter (Signed)
Print  #90 and 2 refills

## 2014-10-06 NOTE — Telephone Encounter (Signed)
Pt is requesting refill on Alprazolam.  Last OV: 08/18/2014 Last Fill: 08/18/2014 # 48 0RF UDS: 05/22/2014 Low risk  Please advise.

## 2014-10-16 ENCOUNTER — Encounter: Payer: Self-pay | Admitting: Internal Medicine

## 2014-10-16 ENCOUNTER — Ambulatory Visit (INDEPENDENT_AMBULATORY_CARE_PROVIDER_SITE_OTHER): Payer: 59 | Admitting: Internal Medicine

## 2014-10-16 VITALS — BP 128/82 | HR 73 | Temp 98.1°F | Ht 71.0 in | Wt 212.1 lb

## 2014-10-16 DIAGNOSIS — F419 Anxiety disorder, unspecified: Secondary | ICD-10-CM

## 2014-10-16 NOTE — Assessment & Plan Note (Signed)
Change from fluoxetine to Cymbalta feeling great. Anxiety well controlled. For insomnia, still taking Ambien and two Xanax qhs . Plan: Continue Cymbalta Continue Ambien Wean off Xanax: Reduce dose to 1. 5 at night for 2 weeks, then 1 at night for 2 weeks, then half at night for 2 weeks and then as needed. Follow-up 3 months for a physical.  Already got a letter from GI, due for a colonoscopy and plans to call them

## 2014-10-16 NOTE — Patient Instructions (Signed)
Please go to the front and schedule a visit  In 4-5 months  for a physical exam     Come back fasting

## 2014-10-16 NOTE — Progress Notes (Signed)
Pre visit review using our clinic review tool, if applicable. No additional management support is needed unless otherwise documented below in the visit note. 

## 2014-10-16 NOTE — Progress Notes (Signed)
   Subjective:    Patient ID: Joe Blanchard, male    DOB: Sep 26, 1960, 54 y.o.   MRN: [id number]  DOS:  10/16/2014 Type of visit - description : f/u Interval history: Was switch from fluoxetine to Cymbalta, feeling great. Still takes Ambien and Xanax at night, sleeping great.   Review of Systems Reports no apparent side effects from Cymbalta  Past Medical History  Diagnosis Date  . Anxiety   . Colon polyps     Cscope 11-11, next 06-2011  . Diabetes mellitus dx 10-12  . Hypertriglyceridemia     a. 11/2013 - 240.    Past Surgical History  Procedure Laterality Date  . Knee surgery  12/03/08    scope  . Scalp laceration repair  ~09-2013    History   Social History  . Marital Status: Married    Spouse Name: N/A  . Number of Children: 2  . 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 Smoking status: Never Smoker   . Smokeless tobacco: Never Used  . Alcohol Use: 1.8 oz/week    3 Glasses of wine per week     Comment: socially   . Drug Use: No  . Sexual Activity:    Partners: Female   Other Topics Concern  . Not on file   Social History Narrative   Lives w/ wife in [place].  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  Does not routinely exercise.        Medication List       This list is accurate as of: 10/16/14 11:59 PM.  Always use your most recent med list.               acetaminophen 500 MG tablet  Commonly known as:  TYLENOL  Take 500 mg by mouth every 6 (six) hours as needed.     ALPRAZolam 0.5 MG tablet  Commonly known as:  XANAX  TAKE 1 TO 2 TABLETS BY MOUTH AT BEDTIME AS NEEDED FOR SLEEP     aspirin EC 81 MG tablet  Take 1 tablet (81 mg total) by mouth daily.     DULoxetine 30 MG capsule  Commonly known as:  CYMBALTA  1 tab a day x 3 weeks , then 2 tabs a day     FISH OIL PO  Take 1 capsule by mouth daily.     zolpidem 10 MG tablet  Commonly known as:  AMBIEN  Take 1 tablet (10 mg total)  by mouth at bedtime as needed for sleep.           Objective:   Physical Exam BP 128/82 mmHg  Pulse 73  Temp(Src) 98.1 F (36.7 C) (Oral)  Ht 5\' 11"  (1.803 m)  Wt 212 lb 2 oz (96.219 kg)  BMI 29.60 kg/m2  SpO2 97% General:   Well developed, well nourished . NAD.  Neurologic:  alert & oriented X3.  Speech normal, gait appropriate for age and unassisted Psych--  Cognition and judgment appear intact.  Cooperative with normal attention span and concentration.  Behavior appropriate. No anxious or depressed appearing.       Assessment & Plan:

## 2014-11-19 ENCOUNTER — Other Ambulatory Visit: Payer: Self-pay

## 2014-12-16 ENCOUNTER — Encounter: Payer: Self-pay | Admitting: Internal Medicine

## 2014-12-26 ENCOUNTER — Other Ambulatory Visit: Payer: Self-pay | Admitting: Internal Medicine

## 2015-02-08 ENCOUNTER — Other Ambulatory Visit: Payer: Self-pay | Admitting: Internal Medicine

## 2015-02-09 NOTE — Telephone Encounter (Signed)
Okay #30 and 5 refills 

## 2015-02-09 NOTE — Telephone Encounter (Signed)
Rx faxed to [facility].  

## 2015-02-09 NOTE — Telephone Encounter (Signed)
Pt is requesting refill on Ambien.  Last OV: 10/16/2014 Last Fill: 08/07/2015 #30 5RF UDS: 05/22/2014 Low risk  Please advise.

## 2015-02-09 NOTE — Telephone Encounter (Signed)
Rx printed, awaiting MD signature.  

## 2015-03-02 ENCOUNTER — Telehealth: Payer: Self-pay | Admitting: Internal Medicine

## 2015-03-02 NOTE — Telephone Encounter (Signed)
pre visit letter mailed 02/26/15

## 2015-03-10 ENCOUNTER — Other Ambulatory Visit: Payer: Self-pay | Admitting: Internal Medicine

## 2015-03-11 ENCOUNTER — Other Ambulatory Visit: Payer: Self-pay | Admitting: Internal Medicine

## 2015-03-11 NOTE — Telephone Encounter (Signed)
Rx faxed to [facility].  

## 2015-03-11 NOTE — Telephone Encounter (Signed)
See rx. 

## 2015-03-11 NOTE — Telephone Encounter (Signed)
Pt is requesting refill on Alprazolam.  Last OV: 10/16/2014 Last Fill: 10/06/2014 #90 2RF UDS: 05/22/2014 Low risk   Please advise.

## 2015-03-18 ENCOUNTER — Telehealth: Payer: Self-pay

## 2015-03-18 NOTE — Telephone Encounter (Signed)
LMOVM

## 2015-03-19 ENCOUNTER — Ambulatory Visit (INDEPENDENT_AMBULATORY_CARE_PROVIDER_SITE_OTHER): Payer: 59 | Admitting: Internal Medicine

## 2015-03-19 ENCOUNTER — Encounter: Payer: Self-pay | Admitting: Internal Medicine

## 2015-03-19 VITALS — BP 116/82 | HR 72 | Temp 98.3°F | Ht 71.0 in | Wt 211.4 lb

## 2015-03-19 DIAGNOSIS — Z1211 Encounter for screening for malignant neoplasm of colon: Secondary | ICD-10-CM

## 2015-03-19 DIAGNOSIS — R7303 Prediabetes: Secondary | ICD-10-CM

## 2015-03-19 DIAGNOSIS — Z0000 Encounter for general adult medical examination without abnormal findings: Secondary | ICD-10-CM | POA: Diagnosis not present

## 2015-03-19 DIAGNOSIS — R7309 Other abnormal glucose: Secondary | ICD-10-CM | POA: Diagnosis not present

## 2015-03-19 DIAGNOSIS — F419 Anxiety disorder, unspecified: Secondary | ICD-10-CM

## 2015-03-19 LAB — LIPID PANEL
CHOLESTEROL: 219 mg/dL — AB (ref 0–200)
HDL: 43 mg/dL (ref >39.00)
NONHDL: 176.47
Total CHOL/HDL Ratio: 5
Triglycerides: 202 mg/dL — ABNORMAL HIGH (ref 0.0–149.0)
VLDL: 40.4 mg/dL — AB (ref 0.0–40.0)

## 2015-03-19 LAB — COMPREHENSIVE METABOLIC PANEL
ALT: 24 U/L (ref 0–53)
AST: 20 U/L (ref 0–37)
Albumin: 4.6 g/dL (ref 3.5–5.2)
Alkaline Phosphatase: 70 U/L (ref 39–117)
BUN: 15 mg/dL (ref 6–23)
CALCIUM: 9.9 mg/dL (ref 8.4–10.5)
CO2: 27 meq/L (ref 19–32)
Chloride: 104 mEq/L (ref 96–112)
Creatinine, Ser: 1.04 mg/dL (ref 0.40–1.50)
GFR: 79.04 mL/min (ref >60.00)
Glucose, Bld: 107 mg/dL — ABNORMAL HIGH (ref 70–99)
POTASSIUM: 4.3 meq/L (ref 3.5–5.1)
Sodium: 140 mEq/L (ref 135–145)
Total Bilirubin: 0.5 mg/dL (ref 0.2–1.2)
Total Protein: 7.7 g/dL (ref 6.0–8.3)

## 2015-03-19 LAB — MICROALBUMIN / CREATININE URINE RATIO
Creatinine,U: 232.4 mg/dL
MICROALB UR: 1.2 mg/dL (ref 0.0–1.9)
MICROALB/CREAT RATIO: 0.5 mg/g (ref 0.0–30.0)

## 2015-03-19 LAB — LDL CHOLESTEROL, DIRECT: Direct LDL: 138 mg/dL

## 2015-03-19 LAB — HEMOGLOBIN A1C: Hgb A1c MFr Bld: 5.8 % (ref 4.6–6.5)

## 2015-03-19 MED ORDER — ALPRAZOLAM 0.5 MG PO TABS: 0.5000 mg | ORAL_TABLET | Freq: Every evening | ORAL | Status: DC | PRN

## 2015-03-19 NOTE — Assessment & Plan Note (Addendum)
Td 2012 + family history of prostate cancer -->   normal DRE 2015, PSAs below 1 consistently. Reassess next year + FH colon cancer, Cscope 11-11 and 08-2011; due for a colonoscopy, will refer to GI. Diet and exercise discussed Labs : CMP, cholesterol, A1c, microalbumin

## 2015-03-19 NOTE — Assessment & Plan Note (Signed)
The patient will be reclassified as prediabetic. Had a single A1c in the diabetic range few years ago, since then A1c has been below 6.

## 2015-03-19 NOTE — Assessment & Plan Note (Signed)
Symptoms well-controlled with Cymbalta and Ambien. He finished Xanax about a week ago but would like to have some "just in case". Also would like to wean off of Cymbalta, that is okay, recommend to decrease to one tablet daily for a few weeks and then stop if so desired.

## 2015-03-19 NOTE — Progress Notes (Signed)
Subjective:    Patient ID: Joe Blanchard, male    DOB: [birth year], 54 y.o.   MRN: [id number]  DOS:  03/19/2015 Type of visit - description : Complete physical exam Interval history: Chronic medical problems were discussed, see assessment and plan   Review of Systems Constitutional: No fever. No chills. No unexplained wt changes. No unusual sweats  HEENT: No dental problems, no ear discharge, no facial swelling, no voice changes. No eye discharge, no eye  redness , no  intolerance to light   Respiratory: No wheezing , no  difficulty breathing. No cough , no mucus production  Cardiovascular: No CP, no leg swelling , no  Palpitations  GI: no nausea, no vomiting, no diarrhea , no  abdominal pain.  No blood in the stools. No dysphagia, no odynophagia    Endocrine: No polyphagia, no polyuria , no polydipsia  GU: No dysuria, gross hematuria, difficulty urinating. No urinary urgency, no frequency.  Musculoskeletal: No joint swellings , continue with persistent left knee pain in moderation  Skin: No change in the color of the skin, palor , no  Rash  Allergic, immunologic: No environmental allergies , no  food allergies  Neurological: No dizziness no  syncope. No headaches. No diplopia, no slurred, no slurred speech, no motor deficits, no facial  Numbness  Hematological: No enlarged lymph nodes, no easy bruising , no unusual bleedings  Psychiatry: No suicidal ideas, no hallucinations, no beavior problems, no confusion.  No unusual/severe anxiety, no depression   Past Medical History  Diagnosis Date  . Anxiety   . Colon polyps     Cscope 11-11, next 06-2011  . Hypertriglyceridemia     a. 11/2013 - 240.  Marland Kitchen Prediabetes 06/24/2011    Past Surgical History  Procedure Laterality Date  . Knee surgery  12/03/08    scope  . Scalp laceration repair  ~09-2013    History   Social History  . Marital Status: Married    Spouse Name: N/A  . Number of Children: 2  . Years of  Education: N/A   Occupational History  . 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 Smoking status: Never Smoker   . Smokeless tobacco: Never Used  . Alcohol Use: 1.8 oz/week    3 Glasses of wine per week     Comment: socially   . Drug Use: No  . Sexual Activity:    Partners: Female   Other Topics Concern  . Not on file   Social History Narrative   Lives w/ wife in [place].  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       Family History  Problem Relation Age of Onset  . Colon cancer Father     dx in his 84s  . Lupus Sister     anticoagulant  . Heart disease Other     GF  . Colon polyps Other   . Prostate cancer Father     dx in his 56s?  . Diabetes Neg Hx   . Other Father     died suddenly @ 44.  . Other Mother     alive & well in her 66's.       Medication List       This list is accurate as of: 03/19/15  2:50 PM.  Always use your most recent med list.               acetaminophen 500 MG tablet  Commonly known as:  TYLENOL  Take 500 mg by mouth every 6 (six) hours as needed.     ALPRAZolam 0.5 MG tablet  Commonly known as:  XANAX  Take 1-2 tablets (0.5-1 mg total) by mouth at bedtime as needed.     aspirin EC 81 MG tablet  Take 1 tablet (81 mg total) by mouth daily.     DULoxetine 30 MG capsule  Commonly known as:  CYMBALTA  Take 2 capsules (60 mg total) by mouth daily.     FISH OIL PO  Take 1 capsule by mouth daily.     zolpidem 10 MG tablet  Commonly known as:  AMBIEN  Take 1 tablet (10 mg total) by mouth at bedtime as needed for sleep.           Objective:   Physical Exam BP 116/82 mmHg  Pulse 72  Temp(Src) 98.3 F (36.8 C) (Oral)  Ht 5\' 11"  (1.803 m)  Wt 211 lb 6 oz (95.879 kg)  BMI 29.49 kg/m2  SpO2 95% General:   Well developed, well nourished . NAD.  HEENT:  Normocephalic . Face symmetric, atraumatic Neck: No thyromegaly Lungs:  CTA B Normal respiratory effort, no intercostal retractions, no accessory muscle use. Heart:  RRR,  no murmur.  no pretibial edema bilaterally  Abdomen:  Not distended, soft, non-tender. No rebound or rigidity. No mass,organomegaly Skin: Not pale. Not jaundice Neurologic:  alert & oriented X3.  Speech normal, gait appropriate for age and unassisted Psych--  Cognition and judgment appear intact.  Cooperative with normal attention span and concentration.  Behavior appropriate. No anxious or depressed appearing.    Assessment & Plan:

## 2015-03-19 NOTE — Patient Instructions (Signed)
Get your blood work before you leave    

## 2015-03-19 NOTE — Progress Notes (Signed)
Pre visit review using our clinic review tool, if applicable. No additional management support is needed unless otherwise documented below in the visit note. 

## 2015-04-14 ENCOUNTER — Encounter: Payer: Self-pay | Admitting: Internal Medicine

## 2015-04-27 ENCOUNTER — Other Ambulatory Visit: Payer: Self-pay | Admitting: Internal Medicine

## 2015-06-05 ENCOUNTER — Ambulatory Visit (AMBULATORY_SURGERY_CENTER): Payer: Self-pay

## 2015-06-05 VITALS — Ht 71.0 in | Wt 218.0 lb

## 2015-06-05 DIAGNOSIS — Z86010 Personal history of colonic polyps: Secondary | ICD-10-CM

## 2015-06-05 MED ORDER — NA SULFATE-K SULFATE-MG SULF 17.5-3.13-1.6 GM/177ML PO SOLN: 1.0000 | Freq: Once | ORAL | Status: DC

## 2015-06-05 NOTE — Progress Notes (Signed)
No egg or soy allergies Not on home 02 No previous anesthesia complications No diet or weight loss meds 

## 2015-06-19 ENCOUNTER — Ambulatory Visit (AMBULATORY_SURGERY_CENTER): Payer: 59 | Admitting: Internal Medicine

## 2015-06-19 ENCOUNTER — Encounter: Payer: Self-pay | Admitting: Internal Medicine

## 2015-06-19 VITALS — BP 114/78 | HR 59 | Temp 96.9°F | Resp 23 | Ht 71.0 in | Wt 218.0 lb

## 2015-06-19 DIAGNOSIS — Z86010 Personal history of colonic polyps: Secondary | ICD-10-CM

## 2015-06-19 DIAGNOSIS — Z800 Family history of malignant neoplasm of digestive organs: Secondary | ICD-10-CM

## 2015-06-19 MED ORDER — SODIUM CHLORIDE 0.9 % IV SOLN: 500.0000 mL | INTRAVENOUS | Status: DC

## 2015-06-19 NOTE — Patient Instructions (Signed)
Findings:  Diverticulosis Recommendations:  Follow up colonoscopy in 5 years  YOU HAD AN ENDOSCOPIC PROCEDURE TODAY AT [facility]:   Refer to the procedure report that was given to you for any specific questions about what was found during the examination.  If the procedure report does not answer your questions, please call your gastroenterologist to clarify.  If you requested that your care partner not be given the details of your procedure findings, then the procedure report has been included in a sealed envelope for you to review at your convenience later.  YOU SHOULD EXPECT: Some feelings of bloating in the abdomen. Passage of more gas than usual.  Walking can help get rid of the air that was put into your GI tract during the procedure and reduce the bloating. If you had a lower endoscopy (such as a colonoscopy or flexible sigmoidoscopy) you may notice spotting of blood in your stool or on the toilet paper. If you underwent a bowel prep for your procedure, you may not have a normal bowel movement for a few days.  Please Note:  You might notice some irritation and congestion in your nose or some drainage.  This is from the oxygen used during your procedure.  There is no need for concern and it should clear up in a day or so.  SYMPTOMS TO REPORT IMMEDIATELY:   Following lower endoscopy (colonoscopy or flexible sigmoidoscopy):  Excessive amounts of blood in the stool  Significant tenderness or worsening of abdominal pains  Swelling of the abdomen that is new, acute  Fever of 100F or higher   Following upper endoscopy (EGD)  Vomiting of blood or coffee ground material  New chest pain or pain under the shoulder blades  Painful or persistently difficult swallowing  New shortness of breath  Fever of 100F or higher  Black, tarry-looking stools  For urgent or emergent issues, a gastroenterologist can be reached at any hour by calling [phone].   DIET: Your first  meal following the procedure should be a small meal and then it is ok to progress to your normal diet. Heavy or fried foods are harder to digest and may make you feel nauseous or bloated.  Likewise, meals heavy in dairy and vegetables can increase bloating.  Drink plenty of fluids but you should avoid alcoholic beverages for 24 hours.  ACTIVITY:  You should plan to take it easy for the rest of today and you should NOT DRIVE or use heavy machinery until tomorrow (because of the sedation medicines used during the test).    FOLLOW UP: Our staff will call the number listed on your records the next business day following your procedure to check on you and address any questions or concerns that you may have regarding the information given to you following your procedure. If we do not reach you, we will leave a message.  However, if you are feeling well and you are not experiencing any problems, there is no need to return our call.  We will assume that you have returned to your regular daily activities without incident.  If any biopsies were taken you will be contacted by phone or by letter within the next 1-3 weeks.  Please call us at [phone] if you have not heard about the biopsies in 3 weeks.    SIGNATURES/CONFIDENTIALITY: You and/or your care partner have signed paperwork which will be entered into your electronic medical record.  These signatures attest to the fact that  that the information above on your After Visit Summary has been reviewed and is understood.  Full responsibility of the confidentiality of this discharge information lies with you and/or your care-partner.  Please follow all discharge instructions given to you by the recovery room nurse. If you have any questions or problems after discharge please call one of the numbers listed above. You will receive a phone call in the am to see how you are doing and answer any questions you may have. Thank you for choosing [facility] for your health care needs.

## 2015-06-19 NOTE — Op Note (Signed)
[facility]  [address]. [place], [id number]   COLONOSCOPY PROCEDURE REPORT  PATIENT: Joe Blanchard, Joe Blanchard  MR#: [id number] BIRTHDATE: 12-18-1960 , 29  yrs. old GENDER: male ENDOSCOPIST: Eustace Quail, MD REFERRED SN:[id number] Program Recall PROCEDURE DATE:  06/19/2015 PROCEDURE:   Colonoscopy, surveillance First Screening Colonoscopy - Avg.  risk and is 50 yrs.  old or older - No.  Prior Negative Screening - Now for repeat screening. N/A  History of Adenoma - Now for follow-up colonoscopy & has been > or = to 3 yrs.  Yes hx of adenoma.  Has been 3 or more years since last colonoscopy.  Polyps removed today? No Recommend repeat exam, <10 yrs? Yes high risk ASA CLASS:   Class II INDICATIONS:Surveillance due to prior colonic neoplasia, PH Colon Adenoma, and FH Colon or Rectal Adenocarcinoma. . Father with colon cancer at age 15. Index examination November 2011 with benign tubular adenomas; follow-up January 2013 with hyperplastic polyps MEDICATIONS: Monitored anesthesia care and Propofol 250 mg IV  DESCRIPTION OF PROCEDURE:   After the risks benefits and alternatives of the procedure were thoroughly explained, informed consent was obtained.  The digital rectal exam revealed no abnormalities of the rectum.   The LB [id number]  endoscope was introduced through the anus and advanced to the cecum, which was identified by both the appendix and ileocecal valve. No adverse events experienced.   The quality of the prep was excellent. (Suprep was used)  The instrument was then slowly withdrawn as the colon was fully examined. Estimated blood loss is zero unless otherwise noted in this procedure report.    COLON FINDINGS: There was moderate diverticulosis noted throughout the entire examined colon.   The examination was otherwise normal. Retroflexed views revealed no abnormalities. The time to cecum = 1.5 Withdrawal time = 9.1   The scope was withdrawn and  the procedure completed. COMPLICATIONS: There were no immediate complications.  ENDOSCOPIC IMPRESSION: 1.   Moderate diverticulosis was noted throughout the entire examined colon 2.   The examination was otherwise normal  RECOMMENDATIONS: 1. Follow up colonoscopy in 5 years  (Personal and Family Hx)  eSigned:  Eustace Quail, MD 06/19/2015 9:01 AM   cc: The Patient and Kathlene November, MD

## 2015-06-19 NOTE — Progress Notes (Signed)
Report to PACU, RN, vss, BBS= Clear.  

## 2015-06-22 ENCOUNTER — Telehealth: Payer: Self-pay | Admitting: *Deleted

## 2015-06-22 NOTE — Telephone Encounter (Signed)
No answer, message left for the patient. 

## 2015-07-23 ENCOUNTER — Telehealth: Payer: Self-pay | Admitting: Internal Medicine

## 2015-07-23 NOTE — Telephone Encounter (Signed)
LM to schedule flu shot or update records.

## 2015-08-12 ENCOUNTER — Other Ambulatory Visit: Payer: Self-pay | Admitting: Internal Medicine

## 2015-08-12 NOTE — Telephone Encounter (Signed)
Pt is requesting refill on Ambien.  Last OV: 04/08/2015 Last Fill: 02/09/2015 #30 and 5RF UDS: Not needed   Please advise.

## 2015-08-12 NOTE — Telephone Encounter (Signed)
Rx printed, awaiting MD signature.  

## 2015-08-12 NOTE — Telephone Encounter (Signed)
[facility] #30, 5 RF

## 2015-08-13 NOTE — Telephone Encounter (Signed)
Rx faxed to [facility].  

## 2015-09-23 ENCOUNTER — Encounter: Payer: Self-pay | Admitting: Internal Medicine

## 2015-09-23 ENCOUNTER — Ambulatory Visit (INDEPENDENT_AMBULATORY_CARE_PROVIDER_SITE_OTHER): Payer: 59 | Admitting: Internal Medicine

## 2015-09-23 VITALS — BP 132/84 | HR 89 | Temp 97.9°F | Ht 71.0 in | Wt 215.2 lb

## 2015-09-23 DIAGNOSIS — Z23 Encounter for immunization: Secondary | ICD-10-CM | POA: Diagnosis not present

## 2015-09-23 DIAGNOSIS — F419 Anxiety disorder, unspecified: Secondary | ICD-10-CM

## 2015-09-23 MED ORDER — ALPRAZOLAM 0.5 MG PO TABS: 0.5000 mg | ORAL_TABLET | Freq: Every evening | ORAL | Status: DC | PRN

## 2015-09-23 MED ORDER — DULOXETINE HCL 30 MG PO CPEP: 30.0000 mg | ORAL_CAPSULE | Freq: Every day | ORAL | Status: DC

## 2015-09-23 NOTE — Progress Notes (Signed)
Pre visit review using our clinic review tool, if applicable. No additional management support is needed unless otherwise documented below in the visit note. 

## 2015-09-23 NOTE — Progress Notes (Signed)
Subjective:    Patient ID: Joe Blanchard, male    DOB: Sep 15, 1960, 55 y.o.   MRN: [id number]  DOS:  09/23/2015 Type of visit - description : Routine visit Interval history: In general feeling well, some stress lately related to issues with his son at school. Insomnia we'll control, take Ambien or Xanax; occasionally both with good results. Anxiety: On Cymbalta, self decreased to 30 mg daily and working well for him  Review of Systems  No chest pain or difficulty breathing No nausea, vomiting, diarrhea  Past Medical History  Diagnosis Date  . Anxiety   . Colon polyps     Cscope 11-11, next 06-2011  . Hypertriglyceridemia     a. 11/2013 - 240.  Marland Kitchen Prediabetes 06/24/2011  . Arthritis     Past Surgical History  Procedure Laterality Date  . Knee surgery  12/03/08    scope  . Scalp laceration repair  ~09-2013  . Colonoscopy    . Polypectomy      Social History   Social History  . Marital Status: Married    Spouse Name: N/A  . Number of Children: 2  . 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 Smoking status: Never Smoker   . Smokeless tobacco: Never Used  . Alcohol Use: 1.8 oz/week    3 Glasses of wine per week     Comment: socially   . Drug Use: No  . Sexual Activity:    Partners: Female   Other Topics Concern  . Not on file   Social History Narrative   Lives w/ wife in [place].  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          Medication List       This list is accurate as of: 09/23/15  2:01 PM.  Always use your most recent med list.               acetaminophen 500 MG tablet  Commonly known as:  TYLENOL  Take 500 mg by mouth every 6 (six) hours as needed. Reported on 09/23/2015     ALPRAZolam 0.5 MG tablet  Commonly known as:  XANAX  Take 1-2 tablets (0.5-1 mg total) by mouth at bedtime as needed.     aspirin EC 81 MG tablet  Take 1 tablet (81 mg total) by mouth daily.     DULoxetine 30 MG  capsule  Commonly known as:  CYMBALTA  Take 1 capsule (30 mg total) by mouth daily.     FISH OIL PO  Take 1 capsule by mouth daily.     zolpidem 10 MG tablet  Commonly known as:  AMBIEN  Take 1 tablet (10 mg total) by mouth at bedtime as needed for sleep.           Objective:   Physical Exam BP 132/84 mmHg  Pulse 89  Temp(Src) 97.9 F (36.6 C) (Oral)  Ht 5\' 11"  (1.803 m)  Wt 215 lb 4 oz (97.637 kg)  BMI 30.03 kg/m2  SpO2 97% General:   Well developed, well nourished . NAD.  HEENT:  Normocephalic . Face symmetric, atraumatic Lungs:  CTA B Normal respiratory effort, no intercostal retractions, no accessory muscle use. Heart: RRR,  no murmur.  No pretibial edema bilaterally  Skin: Not pale. Not jaundice Neurologic:  alert & oriented X3.  Speech normal, gait appropriate for age and unassisted Psych--  Cognition and judgment appear  intact.  Cooperative with normal attention span and concentration.  Behavior appropriate. No anxious or depressed appearing.      Assessment & Plan:   Assessment: Prediabetes Dyslipidemia, high TG Anxiety-insomnia (on ambien and/or xanax prn) Hypogonadism- saw endo 2014, took clomid, T level normalized but pt d/c meds as he did not feel subjectively better  Chest pain, stress test -2015  PLAN Anxiety: We'll control with reduced dose of Cymbalta (30 mg daily). Refills sent. Insomnia: Refill Xanax, contract signed. Labs reviewed with the patient, not due for any testing. 1 care-- flu shot  RTC 6 months, CPX

## 2015-09-23 NOTE — Patient Instructions (Signed)
  GO TO THE FRONT DESK Schedule a complete physical exam to be done by 03-2016  Please be fasting

## 2015-09-29 ENCOUNTER — Telehealth: Payer: Self-pay | Admitting: *Deleted

## 2015-09-29 NOTE — Telephone Encounter (Signed)
Received fax request for PA on Duloxetine, quantity limit of [1] daily/SLS

## 2015-10-21 ENCOUNTER — Telehealth: Payer: Self-pay | Admitting: Internal Medicine

## 2015-10-21 NOTE — Telephone Encounter (Signed)
LVM inquiring if patient received flu shot  °

## 2015-11-09 ENCOUNTER — Telehealth: Payer: Self-pay | Admitting: Internal Medicine

## 2015-11-09 NOTE — Telephone Encounter (Signed)
Spoke with Pt, states he has previously tried Cymbalta 60 mg, he felt it didn't help either. He is also worried about the cost of the Cymbalta, even generic was around $80 dollars. Informed him that I would send message back to Dr. Larose Kells for further advice. Pt verbalized understanding.

## 2015-11-09 NOTE — Telephone Encounter (Signed)
Okay, stop Cymbalta  start Zoloft 100 mg: One tablet daily #30 and one refill Follow-up 4 weeks.  Call if side effects, if not improving will refer to a psychiatrist.

## 2015-11-09 NOTE — Telephone Encounter (Signed)
Can be reached: [phone] [facility]: [id number] [place], [place]  Reason for call: Pt called stating the duloxetine is causing him to be sleepy and lose focus. He asked if there is an alternative that can be sent in for him. It is also $80 per month. If there is a generic/less expensive that would be great too.

## 2015-11-09 NOTE — Telephone Encounter (Signed)
Chart reviewed, used to do very well on Cymbalta 60 mg, recommend to switch to 60 mg daily, send a new prescription. Schedule a checkup 6 weeks from today

## 2015-11-09 NOTE — Telephone Encounter (Signed)
Seen on 09/23/2015, please advise.

## 2015-11-10 MED ORDER — SERTRALINE HCL 100 MG PO TABS: 100.0000 mg | ORAL_TABLET | Freq: Every day | ORAL | Status: DC

## 2015-11-10 NOTE — Telephone Encounter (Signed)
Spoke w/ Pt, informed him to d/c Cymbalta and start Zoloft 100 mg 1 tablet daily (Rx sent). Pt has scheduled F/U appt for 12/09/2015. Instructed him to call if side effects or not improving.

## 2015-12-04 ENCOUNTER — Encounter: Payer: Self-pay | Admitting: Internal Medicine

## 2015-12-04 ENCOUNTER — Ambulatory Visit (INDEPENDENT_AMBULATORY_CARE_PROVIDER_SITE_OTHER): Payer: 59 | Admitting: Internal Medicine

## 2015-12-04 VITALS — BP 116/76 | HR 61 | Temp 98.2°F | Ht 71.0 in | Wt 211.5 lb

## 2015-12-04 DIAGNOSIS — K429 Umbilical hernia without obstruction or gangrene: Secondary | ICD-10-CM

## 2015-12-04 DIAGNOSIS — F419 Anxiety disorder, unspecified: Secondary | ICD-10-CM | POA: Diagnosis not present

## 2015-12-04 DIAGNOSIS — Z09 Encounter for follow-up examination after completed treatment for conditions other than malignant neoplasm: Secondary | ICD-10-CM

## 2015-12-04 MED ORDER — DULOXETINE HCL 30 MG PO CPEP: 30.0000 mg | ORAL_CAPSULE | Freq: Every day | ORAL | Status: DC

## 2015-12-04 NOTE — Progress Notes (Signed)
Pre visit review using our clinic review tool, if applicable. No additional management support is needed unless otherwise documented below in the visit note. 

## 2015-12-04 NOTE — Patient Instructions (Signed)
Start cymbalta 30 mg 1 tab a day Sertraline 100 mg -- 1/2 tablet a day for 1 week then stop  (for 1 week you will take both)

## 2015-12-04 NOTE — Progress Notes (Signed)
Subjective:    Patient ID: Joe Blanchard, male    DOB: Oct 26, 1960, 55 y.o.   MRN: [id number]  DOS:  12/04/2015 Type of visit - description : Acute visit Interval history: Umbilical area has been very sensitive to touch in the last few months, slt bulge? No pain per se.  Also, sertraline is not working for him, likes to go back on Cymbalta   Review of Systems  Denies nausea, vomiting, no abdominal rash. No pain or swelling at the groin or scrotum No dysuria or gross hematuria Past Medical History  Diagnosis Date  . Anxiety   . Colon polyps     Cscope 11-11, next 06-2011  . Hypertriglyceridemia     a. 11/2013 - 240.  Marland Kitchen Prediabetes 06/24/2011  . Arthritis     Past Surgical History  Procedure Laterality Date  . Knee surgery  12/03/08    scope  . Scalp laceration repair  ~09-2013  . Colonoscopy    . Polypectomy      Social History   Social History  . Marital Status: Married    Spouse Name: N/A  . Number of Children: 2  . 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 Smoking status: Never Smoker   . Smokeless tobacco: Never Used  . Alcohol Use: 1.8 oz/week    3 Glasses of wine per week     Comment: socially   . Drug Use: No  . Sexual Activity:    Partners: Female   Other Topics Concern  . Not on file   Social History Narrative   Lives w/ wife in [place].  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          Medication List       This list is accurate as of: 12/04/15 11:59 PM.  Always use your most recent med list.               acetaminophen 500 MG tablet  Commonly known as:  TYLENOL  Take 500 mg by mouth every 6 (six) hours as needed. Reported on 09/23/2015     ALPRAZolam 0.5 MG tablet  Commonly known as:  XANAX  Take 1-2 tablets (0.5-1 mg total) by mouth at bedtime as needed.     aspirin EC 81 MG tablet  Take 1 tablet (81 mg total) by mouth daily.     DULoxetine 30 MG capsule  Commonly known  as:  CYMBALTA  Take 1 capsule (30 mg total) by mouth daily.     FISH OIL PO  Take 1 capsule by mouth daily.     zolpidem 10 MG tablet  Commonly known as:  AMBIEN  Take 1 tablet (10 mg total) by mouth at bedtime as needed for sleep.           Objective:   Physical Exam BP 116/76 mmHg  Pulse 61  Temp(Src) 98.2 F (36.8 C) (Oral)  Ht 5\' 11"  (1.803 m)  Wt 211 lb 8 oz (95.936 kg)  BMI 29.51 kg/m2  SpO2 97% General:   Well developed, well nourished . NAD.  HEENT:  Normocephalic . Face symmetric, atraumatic  Abdomen:  Not distended, soft, non-tender.  + diastassis recti, mild. Has a 3/4 inch umbilical hernia ring, did not bulge with Valsalva Skin: Not pale. Not jaundice Neurologic:  alert & oriented X3.  Speech normal, gait appropriate for age and unassisted Psych--  Cognition and judgment appear  intact.  Cooperative with normal attention span and concentration.  Behavior appropriate. No anxious or depressed appearing.      Assessment & Plan:  Assessment: Prediabetes Dyslipidemia, high TG Anxiety 2012: Rx citalopram --> switch to prozac 09-2012 d/t fatigue, no improvment, switch to cymbalta 08-2014 w/ great results Insomnia (on ambien and/or xanax prn) Hypogonadism- saw endo 2014, took clomid, T level normalized but pt d/c meds as he did not feel subjectively better  Chest pain, stress test -2015  PLAN Small umbilical hernia: discussed observation vs referral, declined   surgical referral  consequently warning symptoms of incarceration discussed. Anxiety: Recently had to switch from Cymbalta to Zoloft due to cost but that is not working for him, will go back on Cymbalta. See instructions.  RTC 03-2016

## 2015-12-05 DIAGNOSIS — Z09 Encounter for follow-up examination after completed treatment for conditions other than malignant neoplasm: Secondary | ICD-10-CM | POA: Insufficient documentation

## 2015-12-05 NOTE — Assessment & Plan Note (Signed)
Small umbilical hernia: discussed observation vs referral, declined   surgical referral  consequently warning symptoms of incarceration discussed. Anxiety: Recently had to switch from Cymbalta to Zoloft due to cost but that is not working for him, will go back on Cymbalta. See instructions.  RTC 03-2016

## 2015-12-09 ENCOUNTER — Ambulatory Visit: Payer: 59 | Admitting: Internal Medicine

## 2016-02-03 ENCOUNTER — Encounter: Payer: Self-pay | Admitting: Internal Medicine

## 2016-02-03 ENCOUNTER — Ambulatory Visit (INDEPENDENT_AMBULATORY_CARE_PROVIDER_SITE_OTHER): Payer: 59 | Admitting: Internal Medicine

## 2016-02-03 VITALS — BP 124/80 | HR 71 | Temp 98.1°F | Ht 71.0 in | Wt 211.5 lb

## 2016-02-03 DIAGNOSIS — Z114 Encounter for screening for human immunodeficiency virus [HIV]: Secondary | ICD-10-CM | POA: Diagnosis not present

## 2016-02-03 DIAGNOSIS — Z0000 Encounter for general adult medical examination without abnormal findings: Secondary | ICD-10-CM | POA: Diagnosis not present

## 2016-02-03 DIAGNOSIS — R739 Hyperglycemia, unspecified: Secondary | ICD-10-CM | POA: Diagnosis not present

## 2016-02-03 DIAGNOSIS — G629 Polyneuropathy, unspecified: Secondary | ICD-10-CM | POA: Diagnosis not present

## 2016-02-03 DIAGNOSIS — Z125 Encounter for screening for malignant neoplasm of prostate: Secondary | ICD-10-CM | POA: Diagnosis not present

## 2016-02-03 NOTE — Progress Notes (Signed)
Pre visit review using our clinic review tool, if applicable. No additional management support is needed unless otherwise documented below in the visit note. 

## 2016-02-03 NOTE — Assessment & Plan Note (Signed)
Td 2012 + FH of prostate cancer -->   normal DRE today, check a  PSA + FH colon cancer, Cscope 11-11 and 08-2011; cscope 06-2015 neg, recheck 5 years per report. Diet and exercise discussed Labs : BMP, FLP, CBC, A1c, TSH, HIV, 123456, folic acid, PSA

## 2016-02-03 NOTE — Patient Instructions (Signed)
GO TO THE LAB :  Get the blood work   Also need a urine sample for a UDS   GO TO THE FRONT DESK Schedule your next appointment for a  Check up in 6 months , no fasting

## 2016-02-03 NOTE — Progress Notes (Signed)
Subjective:    Patient ID: Joe Blanchard, male    DOB: 03-13-1961, 55 y.o.   MRN: [id number]  DOS:  02/03/2016 Type of visit - description : cpx Interval history: In addition to the CPX we did discuss other issues   Review of Systems Constitutional: No fever. No chills. No unexplained wt changes. No unusual sweats  HEENT: No dental problems. Pain at the left ear area for few weeks, no discharge, URI, no facial swelling, no voice changes. No eye discharge, no eye  redness , no  intolerance to light   Respiratory: No wheezing , no  difficulty breathing. No cough , no mucus production  Cardiovascular: No CP, no leg swelling , no  Palpitations  GI: no nausea, no vomiting, no diarrhea , no  abdominal pain.  No blood in the stools. No dysphagia, no odynophagia    Endocrine: No polyphagia, no polyuria , no polydipsia  GU: No dysuria, gross hematuria, difficulty urinating. No urinary urgency, no frequency.  Musculoskeletal: No joint swellings or unusual aches or pains  Skin: No change in the color of the skin, palor , no  Rash  Allergic, immunologic: No environmental allergies , no  food allergies  Neurological: No dizziness no  syncope. No headaches. No diplopia, no slurred, no slurred speech, no motor deficits, no facial  Numbness  Hematological: No enlarged lymph nodes, no easy bruising , no unusual bleedings  Psychiatry: No suicidal ideas, no hallucinations, no beavior problems, no confusion.  No unusual/severe anxiety, no depression    Neurological: No dizziness no  syncope. No headaches. No diplopia, no slurred, no slurred speech, no motor deficits, no facial  Numbness. Many years history of bilateral feet discomfort described as feeling cold, hot, sensitive. No MSK type of pain, rash or swelling. No back pain  Hematological: No enlarged lymph nodes, no easy bruising , no unusual bleedings  Psychiatry: No suicidal ideas, no hallucinations, no beavior problems, no  confusion.  No unusual/severe anxiety, no depression    Past Medical History  Diagnosis Date  . Anxiety   . Colon polyps     Cscope 11-11, next 06-2011  . Hypertriglyceridemia     a. 11/2013 - 240.  Marland Kitchen Prediabetes 06/24/2011  . Arthritis     Past Surgical History  Procedure Laterality Date  . Knee surgery  12/03/08    scope  . Scalp laceration repair  ~09-2013  . Colonoscopy    . Polypectomy      Social History   Social History  . Marital Status: Married    Spouse Name: N/A  . Number of Children: 2  . 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 Smoking status: Never Smoker   . Smokeless tobacco: Never Used  . Alcohol Use: 1.8 oz/week    3 Glasses of wine per week     Comment: socially   . Drug Use: No  . Sexual Activity:    Partners: Female   Other Topics Concern  . Not on file   Social History Narrative   Lives w/ wife in [place].  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       Family History  Problem Relation Age of Onset  . Colon cancer Father     dx in his 68s  . Lupus Sister     anticoagulant  . Heart disease Other     GF  . Colon polyps Other   .  Prostate cancer Father     dx in his 73s?  . Diabetes Neg Hx   . Other Father     died suddenly @ 8.  . Other Mother     alive & well in her 50's.       Medication List       This list is accurate as of: 02/03/16 11:59 PM.  Always use your most recent med list.               acetaminophen 500 MG tablet  Commonly known as:  TYLENOL  Take 500 mg by mouth every 6 (six) hours as needed. Reported on 09/23/2015     ALPRAZolam 0.5 MG tablet  Commonly known as:  XANAX  Take 1-2 tablets (0.5-1 mg total) by mouth at bedtime as needed.     aspirin EC 81 MG tablet  Take 1 tablet (81 mg total) by mouth daily.     DULoxetine 30 MG capsule  Commonly known as:  CYMBALTA  Take 1 capsule (30 mg total) by mouth daily.     FISH OIL PO  Take 1 capsule by  mouth daily.     zolpidem 10 MG tablet  Commonly known as:  AMBIEN  Take 1 tablet (10 mg total) by mouth at bedtime as needed for sleep.           Objective:   Physical Exam BP 124/80 mmHg  Pulse 71  Temp(Src) 98.1 F (36.7 C) (Oral)  Ht 5\' 11"  (1.803 m)  Wt 211 lb 8 oz (95.936 kg)  BMI 29.51 kg/m2  SpO2 97%  General:   Well developed, well nourished . NAD.  Neck: No  thyromegaly . No LAD is or mass HEENT:  Normocephalic . Face symmetric, atraumatic. TMs normal, ear canals normal. No TMJ click or pain Lungs:  CTA B Normal respiratory effort, no intercostal retractions, no accessory muscle use. Heart: RRR,  no murmur.  No pretibial edema bilaterally . Pedal pulses normal Abdomen:  Not distended, soft, non-tender. No rebound or rigidity.   Rectal:  External abnormalities: none. Normal sphincter tone. No rectal masses or tenderness.  Stool brown  Prostate: Prostate gland firm and smooth, no enlargement, nodularity, tenderness, mass, asymmetry or induration.  Skin: Exposed areas without rash. Not pale. Not jaundice Neurologic:  alert & oriented X3.  Speech normal, gait appropriate for age and unassisted Strength symmetric and appropriate for age.  DTRs symmetric, pinprick examination of the lower extremities normal Psych: Cognition and judgment appear intact.  Cooperative with normal attention span and concentration.  Behavior appropriate. No anxious or depressed appearing.    Assessment & Plan:   Assessment: Prediabetes Dyslipidemia, high TG Anxiety 2012: Rx citalopram --> switch to prozac 09-2012 d/t fatigue and no improvment, switch to cymbalta 08-2014 w/ great results Insomnia (on ambien and/or xanax prn) Hypogonadism- saw endo 2014, took clomid, T level normalized but pt d/c meds as he did not feel subjectively better  Chest pain, stress test -2015  PLAN Prediabetes: Check an A1c Anxiety: Currently controlled. Needs a UDS Neuropathy: Sx as described  above likely neuropathy. Pinprick  exam normal. Will check the 123456, folic acid. Probably related to prediabetes. Gabapentin discussed, declined Left ear pain: Exam negative, recommend to await few days, call for ENT referral if needed RTC 6 months

## 2016-02-04 ENCOUNTER — Other Ambulatory Visit: Payer: 59

## 2016-02-04 LAB — CBC WITH DIFFERENTIAL/PLATELET
BASOS ABS: 0 10*3/uL (ref 0.0–0.1)
Basophils Relative: 0.4 % (ref 0.0–3.0)
EOS PCT: 0.7 % (ref 0.0–5.0)
Eosinophils Absolute: 0 10*3/uL (ref 0.0–0.7)
HEMATOCRIT: 43.4 % (ref 39.0–52.0)
Hemoglobin: 14.5 g/dL (ref 13.0–17.0)
LYMPHS ABS: 1.5 10*3/uL (ref 0.7–4.0)
LYMPHS PCT: 22.2 % (ref 12.0–46.0)
MCHC: 33.5 g/dL (ref 30.0–36.0)
MCV: 84.6 fl (ref 78.0–100.0)
MONOS PCT: 5.9 % (ref 3.0–12.0)
Monocytes Absolute: 0.4 10*3/uL (ref 0.1–1.0)
NEUTROS ABS: 4.9 10*3/uL (ref 1.4–7.7)
NEUTROS PCT: 70.8 % (ref 43.0–77.0)
PLATELETS: 201 10*3/uL (ref 150.0–400.0)
RBC: 5.13 Mil/uL (ref 4.22–5.81)
RDW: 13.7 % (ref 11.5–15.5)
WBC: 7 10*3/uL (ref 4.0–10.5)

## 2016-02-04 LAB — HIV ANTIBODY (ROUTINE TESTING W REFLEX): HIV: NONREACTIVE

## 2016-02-04 LAB — BASIC METABOLIC PANEL
BUN: 14 mg/dL (ref 6–23)
CALCIUM: 9.7 mg/dL (ref 8.4–10.5)
CO2: 29 mEq/L (ref 19–32)
CREATININE: 1.02 mg/dL (ref 0.40–1.50)
Chloride: 104 mEq/L (ref 96–112)
GFR: 80.57 mL/min (ref >60.00)
Glucose, Bld: 97 mg/dL (ref 70–99)
Potassium: 4 mEq/L (ref 3.5–5.1)
SODIUM: 137 meq/L (ref 135–145)

## 2016-02-04 LAB — LIPID PANEL
CHOL/HDL RATIO: 5
CHOLESTEROL: 229 mg/dL — AB (ref 0–200)
HDL: 44.3 mg/dL (ref >39.00)
LDL Cholesterol: 150 mg/dL — ABNORMAL HIGH (ref 0–99)
NonHDL: 184.65
TRIGLYCERIDES: 171 mg/dL — AB (ref 0.0–149.0)
VLDL: 34.2 mg/dL (ref 0.0–40.0)

## 2016-02-04 LAB — TSH: TSH: 2.76 u[IU]/mL (ref 0.35–4.50)

## 2016-02-04 LAB — HEMOGLOBIN A1C: Hgb A1c MFr Bld: 5.8 % (ref 4.6–6.5)

## 2016-02-04 LAB — PSA: PSA: 0.77 ng/mL (ref 0.10–4.00)

## 2016-02-04 LAB — FOLATE

## 2016-02-04 LAB — VITAMIN B12: Vitamin B-12: 567 pg/mL (ref 211–911)

## 2016-02-04 NOTE — Assessment & Plan Note (Signed)
Prediabetes: Check an A1c Anxiety: Currently controlled. Needs a UDS Neuropathy: Sx as described above likely neuropathy. Pinprick  exam normal. Will check the 123456, folic acid. Probably related to prediabetes. Gabapentin discussed, declined Left ear pain: Exam negative, recommend to await few days, call for ENT referral if needed RTC 6 months

## 2016-02-11 ENCOUNTER — Telehealth: Payer: Self-pay

## 2016-02-11 NOTE — Telephone Encounter (Signed)
UDS: 02/03/2016  Negative for Alprazolam: PRN Positive for Ambien   Low risk per Dr. Larose Kells 02/11/2016

## 2016-02-17 ENCOUNTER — Other Ambulatory Visit: Payer: Self-pay | Admitting: Internal Medicine

## 2016-02-17 NOTE — Telephone Encounter (Signed)
Refill ambien for Dr. Larose Kells. Pt he is out.

## 2016-02-17 NOTE — Telephone Encounter (Signed)
Rx faxed to [facility].  

## 2016-02-17 NOTE — Telephone Encounter (Signed)
Pt is requesting refill on Ambien. Dr. Ethel Rana Pt.  Last OV: 02/03/2016 Last Fill: 08/12/2015 #30 and 5RF UDS: 02/03/2016 Low risk  Please advise.

## 2016-02-23 ENCOUNTER — Encounter: Payer: Self-pay | Admitting: Internal Medicine

## 2016-04-07 ENCOUNTER — Encounter: Payer: Self-pay | Admitting: Internal Medicine

## 2016-04-07 NOTE — Telephone Encounter (Signed)
Spoke with patient.  He has a $1000 bill from [facility]. I informed the patient that he would need to call them and work out a payment plan and  it is apart of his controled substance contract that he would take periodic drug screens. Patient said he would call them and call me back if he had any more questions

## 2016-04-07 NOTE — Telephone Encounter (Signed)
Left patient a message to call the office regarding billing issue

## 2016-04-14 ENCOUNTER — Encounter: Payer: 59 | Admitting: Internal Medicine

## 2016-05-08 ENCOUNTER — Other Ambulatory Visit: Payer: Self-pay | Admitting: Internal Medicine

## 2016-05-09 ENCOUNTER — Telehealth: Payer: Self-pay | Admitting: Internal Medicine

## 2016-05-09 NOTE — Telephone Encounter (Signed)
Rx faxed to [facility].  

## 2016-05-09 NOTE — Telephone Encounter (Signed)
Pt is requesting refill on Alprazolam.  Last OV: 02/03/2016 Last Fill: 09/23/2015 #30 and 1RF UDS: 02/03/2016 Low risk  Please advise.

## 2016-05-09 NOTE — Telephone Encounter (Signed)
Please advise 

## 2016-05-09 NOTE — Telephone Encounter (Signed)
Spoke w/ Pt, informed him that PCP recommends calling to discuss alternatives w/ insurance company and to bring formulary for 2017 with him to office visit which can help PCP determine what is cheaper if any. Pt verbalized understanding.

## 2016-05-09 NOTE — Telephone Encounter (Signed)
Ask pt to check if EFFEXOR (venlafaxine) is covered

## 2016-05-09 NOTE — Telephone Encounter (Signed)
Ok 30 and 2 RF 

## 2016-05-09 NOTE — Telephone Encounter (Signed)
Rx printed, awaiting MD signature.  

## 2016-05-09 NOTE — Telephone Encounter (Signed)
Patient Relation: Self Patient Phone: [phone] is taking    DULoxetine (CYMBALTA) 30 MG capsule and unfortunately the medication is becoming very expensive. Patient would like to know if he could possibly try another medication that would not be so expensive. Pt would also like to know if he could discuss this at his upcoming appointment for tenderness in feet. He is aware that he will probably need to schedule another appointment for the medication questions.

## 2016-05-17 ENCOUNTER — Ambulatory Visit: Payer: 59 | Admitting: Internal Medicine

## 2016-05-22 ENCOUNTER — Other Ambulatory Visit: Payer: Self-pay | Admitting: Medical

## 2016-05-26 ENCOUNTER — Other Ambulatory Visit: Payer: Self-pay | Admitting: Internal Medicine

## 2016-05-26 NOTE — Telephone Encounter (Signed)
Patient called to follow up on request for refill. Pt states he is out of zolpidem (AMBIEN) 10 MG tablet [id number]. Plse adv

## 2016-05-26 NOTE — Telephone Encounter (Signed)
Pharmacy is calling to request a refill of patient's zolpidem (AMBIEN) 10 MG tablet. Please advise.   Pharmacy: [id number] - [place], [place] contact: [phone]

## 2016-05-26 NOTE — Telephone Encounter (Signed)
Last seen 02/03/16 Last filled #30-2 rf 05/09/16 Please advise

## 2016-05-27 ENCOUNTER — Telehealth: Payer: Self-pay | Admitting: Medical

## 2016-05-27 NOTE — Telephone Encounter (Signed)
Rx printed, awaiting MD signature.  

## 2016-05-27 NOTE — Telephone Encounter (Signed)
Rx request w/ PCP. Awaiting response.

## 2016-05-27 NOTE — Telephone Encounter (Signed)
Rx faxed to [facility].  

## 2016-05-27 NOTE — Telephone Encounter (Signed)
I was reviewing the refill request on ambien. I never saw pt. So not sure why sent to me. Did I fill it one time maybe when covering for Dr. Larose Kells? Looks like it has been handled.

## 2016-05-27 NOTE — Telephone Encounter (Signed)
Okay 30 and 3 refills 

## 2016-05-30 NOTE — Telephone Encounter (Signed)
Unsure why refill was sent to you as I was not here for the majority of the week last week. However, per chart Dr. Larose Kells filled on 05/27/2016.

## 2016-06-07 ENCOUNTER — Other Ambulatory Visit: Payer: Self-pay | Admitting: Internal Medicine

## 2016-06-13 ENCOUNTER — Encounter: Payer: Self-pay | Admitting: Internal Medicine

## 2016-06-14 MED ORDER — DULOXETINE HCL 60 MG PO CPEP: 60.0000 mg | ORAL_CAPSULE | Freq: Every day | ORAL | 2 refills | Status: DC

## 2016-06-14 NOTE — Telephone Encounter (Signed)
Rx sent 

## 2016-06-14 NOTE — Telephone Encounter (Signed)
Call a prescription duloxetine 60 mg 1 by mouth daily #30, 2 refills. see message  Received: Today  [place], MD  Damita Dunnings, CMA

## 2016-07-12 ENCOUNTER — Encounter: Payer: Self-pay | Admitting: Internal Medicine

## 2016-07-12 ENCOUNTER — Ambulatory Visit: Payer: Self-pay | Admitting: Internal Medicine

## 2016-07-12 ENCOUNTER — Ambulatory Visit (INDEPENDENT_AMBULATORY_CARE_PROVIDER_SITE_OTHER): Payer: 59 | Admitting: Internal Medicine

## 2016-07-12 VITALS — BP 122/72 | HR 91 | Temp 98.1°F | Resp 14 | Ht 71.0 in | Wt 203.4 lb

## 2016-07-12 DIAGNOSIS — F419 Anxiety disorder, unspecified: Secondary | ICD-10-CM | POA: Diagnosis not present

## 2016-07-12 DIAGNOSIS — Z23 Encounter for immunization: Secondary | ICD-10-CM | POA: Diagnosis not present

## 2016-07-12 MED ORDER — DULOXETINE HCL 60 MG PO CPEP: 60.0000 mg | ORAL_CAPSULE | Freq: Every day | ORAL | 5 refills | Status: DC

## 2016-07-12 MED ORDER — ALPRAZOLAM 0.5 MG PO TABS: 0.5000 mg | ORAL_TABLET | Freq: Every evening | ORAL | 2 refills | Status: DC | PRN

## 2016-07-12 NOTE — Progress Notes (Signed)
Subjective:    Patient ID: Joe Blanchard, male    DOB: 24-May-1961, 55 y.o.   MRN: [id number]  DOS:  07/12/2016 Type of visit - description : f/u Interval history: Few weeks ago, he had to quit his job of 31 years because it become unmanageable, that has definitely decreased the stress but has created new ones mostly financial ones. Fortunately, he asked to increase Cymbalta, is helping a great deal. Also, he is seen Joe Blanchard, a counselor and she is helping significantly. Overall, he is anxious about the future but definitely feeling better. He was trying to "avoid" using Xanax and Ambien but now realizes that he use them responsibly and is taking them a little more frequently. Also, is exercising more, has lost some weight.  Review of Systems   Past Medical History:  Diagnosis Date  . Anxiety   . Arthritis   . Colon polyps    Cscope 11-11, next 06-2011  . Hypertriglyceridemia    a. 11/2013 - 240.  Marland Kitchen Prediabetes 06/24/2011    Past Surgical History:  Procedure Laterality Date  . KNEE SURGERY  12/03/08   scope  . POLYPECTOMY    . SCALP LACERATION REPAIR  ~09-2013    Social History   Social History  . Marital status: Married    Spouse name: N/A  . Number of children: 2  . Years of education: N/A   Occupational History  . car-insurance claimns  [employer]    Social History Main Topics  . Smoking status: Never Smoker  . Smokeless tobacco: Never Used  . Alcohol use 1.8 oz/week    3 Glasses of wine per week     Comment: socially   . Drug use: No  . Sexual activity: Yes    Partners: Female   Other Topics Concern  . Not on file   Social History Narrative   Lives w/ wife in [place].  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          Medication List       Accurate as of 07/12/16  4:12 PM. Always use your most recent med list.          acetaminophen 500 MG tablet Commonly known as:  TYLENOL Take 500 mg by mouth every 6 (six) hours as needed. Reported on 09/23/2015     ALPRAZolam 0.5 MG tablet Commonly known as:  XANAX Take 1 tablet (0.5 mg total) by mouth at bedtime as needed.   aspirin EC 81 MG tablet Take 1 tablet (81 mg total) by mouth daily.   DULoxetine 60 MG capsule Commonly known as:  CYMBALTA Take 1 capsule (60 mg total) by mouth daily.   FISH OIL PO Take 1 capsule by mouth daily.   zolpidem 10 MG tablet Commonly known as:  AMBIEN Take 1 tablet (10 mg total) by mouth at bedtime as needed for sleep.          Objective:   Physical Exam BP 122/72 (BP Location: Left Arm, Patient Position: Sitting, Cuff Size: Normal)   Pulse 91   Temp 98.1 F (36.7 C) (Oral)   Resp 14   Ht 5\' 11"  (1.803 m)   Wt 203 lb 6 oz (92.3 kg)   SpO2 96%   BMI 28.37 kg/m  General:   Well developed, well nourished . NAD.  HEENT:  Normocephalic . Face symmetric, atraumatic Skin: Not pale. Not jaundice Neurologic:  alert & oriented X3.  Speech normal, gait appropriate for age and unassisted Psych--  Cognition and judgment appear intact.  Cooperative with normal attention span and concentration.  Behavior appropriate. Emotionally he seems to be doing better than in the previous 2 or 3 visits.      Assessment & Plan:    Assessment: Prediabetes Dyslipidemia, high TG Anxiety 2012: Rx citalopram --> switch to prozac 09-2012 d/t fatigue and no improvment, switch to cymbalta 08-2014 w/ great results Insomnia (on ambien and/or xanax prn) Hypogonadism- saw endo 2014, took clomid, T level normalized but pt d/c meds as he did not feel subjectively better  Chest pain, stress test -2015  PLAN Anxiety-- See history of present illness, after he quit his job, stress decreased significantly; of course the move has created new anxiety mostly financial but is managing very well with the recent increase in Cymbalta and under the care of Joe Blanchard, a counselor. Plan: Refill medications, contract signed. Counseled here today as well.  RTC 4 months.

## 2016-07-12 NOTE — Progress Notes (Signed)
Pre visit review using our clinic review tool, if applicable. No additional management support is needed unless otherwise documented below in the visit note. 

## 2016-07-14 NOTE — Assessment & Plan Note (Signed)
Anxiety-- See history of present illness, after he quit his job, stress decreased significantly; of course the move has created new anxiety mostly financial but is managing very well with the recent increase in Cymbalta and under the care of Arlyn Leak, a counselor. Plan: Refill medications, contract signed. Counseled here today as well.  RTC 4 months.

## 2016-08-04 ENCOUNTER — Ambulatory Visit: Payer: 59 | Admitting: Internal Medicine

## 2016-09-21 ENCOUNTER — Telehealth: Payer: Self-pay

## 2016-09-21 MED ORDER — ZOLPIDEM TARTRATE 10 MG PO TABS: 10.0000 mg | ORAL_TABLET | Freq: Every evening | ORAL | 1 refills | Status: DC | PRN

## 2016-09-21 NOTE — Telephone Encounter (Signed)
Ok #30, 1 RF 

## 2016-09-21 NOTE — Telephone Encounter (Signed)
Rx faxed to [facility] pharmacy.  

## 2016-09-21 NOTE — Telephone Encounter (Signed)
Rx printed, awaiting MD signature.  

## 2016-09-21 NOTE — Telephone Encounter (Signed)
Pt is requesting refill on Ambien.  Last OV: 07/12/2016 Last Fill: 05/27/2016 #30 and 3RF UDS: Not needed for Ambien  Please advise.

## 2016-11-09 ENCOUNTER — Encounter: Payer: Self-pay | Admitting: Internal Medicine

## 2016-11-09 ENCOUNTER — Ambulatory Visit (INDEPENDENT_AMBULATORY_CARE_PROVIDER_SITE_OTHER): Payer: 59 | Admitting: Internal Medicine

## 2016-11-09 VITALS — BP 126/74 | HR 64 | Temp 98.4°F | Resp 14 | Ht 71.0 in | Wt 214.1 lb

## 2016-11-09 DIAGNOSIS — E785 Hyperlipidemia, unspecified: Secondary | ICD-10-CM | POA: Diagnosis not present

## 2016-11-09 DIAGNOSIS — F419 Anxiety disorder, unspecified: Secondary | ICD-10-CM | POA: Diagnosis not present

## 2016-11-09 MED ORDER — ZOSTER VAC RECOMB ADJUVANTED 50 MCG/0.5ML IM SUSR: 0.5000 mL | Freq: Once | INTRAMUSCULAR | 1 refills | Status: AC

## 2016-11-09 NOTE — Progress Notes (Signed)
Subjective:    Patient ID: Joe Blanchard, male    DOB: Jan 11, 1961, 56 y.o.   MRN: [id number]  DOS:  11/09/2016 Type of visit - description : rov Interval history: Feeling very well, good compliance with medication except for fish oil, takes inconsistently. Currently not working, looking for a nonstressful job. Feeling well with his decision. Wife is in her 73s, she was just diagnosed with shingles, is very painful and he wonders about vaccination for him   Review of Systems   Past Medical History:  Diagnosis Date  . Anxiety   . Arthritis   . Colon polyps    Cscope 11-11, next 06-2011  . Hypertriglyceridemia    a. 11/2013 - 240.  Marland Kitchen Prediabetes 06/24/2011    Past Surgical History:  Procedure Laterality Date  . KNEE SURGERY  12/03/08   scope  . POLYPECTOMY    . SCALP LACERATION REPAIR  ~09-2013    Social History   Social History  . Marital status: Married    Spouse name: N/A  . Number of children: 2  . Years of education: N/A   Occupational History  . 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 Smoking status: Never Smoker  . Smokeless tobacco: Never Used  . Alcohol use 1.8 oz/week    3 Glasses of wine per week     Comment: socially   . Drug use: No  . Sexual activity: Yes    Partners: Female   Other Topics Concern  . Not on file   Social History Narrative   Lives w/ wife in [place].          Allergies as of 11/09/2016   No Known Allergies     Medication List       Accurate as of 11/09/16  5:43 PM. Always use your most recent med list.          acetaminophen 500 MG tablet Commonly known as:  TYLENOL Take 500 mg by mouth every 6 (six) hours as needed. Reported on 09/23/2015   ALPRAZolam 0.5 MG tablet Commonly known as:  XANAX Take 1 tablet (0.5 mg total) by mouth at bedtime as needed.   aspirin EC 81 MG tablet Take 1 tablet (81 mg total) by mouth daily.   DULoxetine 60 MG capsule Commonly known as:   CYMBALTA Take 1 capsule (60 mg total) by mouth daily.   FISH OIL PO Take 1 capsule by mouth daily.   zolpidem 10 MG tablet Commonly known as:  AMBIEN Take 1 tablet (10 mg total) by mouth at bedtime as needed for sleep.   Zoster Vac Recomb Adjuvanted injection Commonly known as:  SHINGRIX Inject 0.5 mLs into the muscle once. Second dose 2 to 6 months latter          Objective:   Physical Exam BP 126/74 (BP Location: Left Arm, Patient Position: Sitting, Cuff Size: Normal)   Pulse 64   Temp 98.4 F (36.9 C) (Oral)   Resp 14   Ht 5\' 11"  (1.803 m)   Wt 214 lb 2 oz (97.1 kg)   SpO2 98%   BMI 29.86 kg/m  General:   Well developed, well nourished . NAD.  HEENT:  Normocephalic . Face symmetric, atraumatic Lungs:  CTA B Normal respiratory effort, no intercostal retractions, no accessory muscle use. Heart: RRR,  no murmur.  No pretibial edema bilaterally  Skin: Not pale. Not jaundice Neurologic:  alert & oriented X3.  Speech normal, gait appropriate for age and unassisted Psych--  Cognition and judgment appear intact.  Cooperative with normal attention span and concentration.  Behavior appropriate. No anxious or depressed appearing.      Assessment & Plan:   Assessment: Prediabetes Dyslipidemia, high TG Anxiety 2012: Rx citalopram --> switch to prozac 09-2012 d/t fatigue and no improvment, switch to cymbalta 08-2014 w/ great results Insomnia (on ambien and/or xanax prn) Hypogonadism- saw endo 2014, took clomid, T level normalized but pt d/c meds as he did not feel subjectively better  Chest pain, stress test -2015  PLAN Anxiety, insomnia: Currently well controlled with Cymbalta, Xanax and Ambien. He still sees Arlyn Leak his counselor. Not working at present; pt counseled  today, he feels well . Due for a UDS but will skip that (currently under [employer] [employer], $$) Dyslipidemia, high TG: Encourage good compliance with fish oil. Shingles vaccination: Pro-cons d/w  pt regards timing and 2 different vaccines available. He elected to get a prescription for shingrix and is thinking about getting it at his pharmacy. RTC 4-6 months, CPX.

## 2016-11-09 NOTE — Progress Notes (Signed)
Pre visit review using our clinic review tool, if applicable. No additional management support is needed unless otherwise documented below in the visit note. 

## 2016-11-09 NOTE — Patient Instructions (Signed)
Next visit 4 to 6 months for a physical

## 2016-11-09 NOTE — Assessment & Plan Note (Signed)
Anxiety, insomnia: Currently well controlled with Cymbalta, Xanax and Ambien. He still sees Arlyn Leak his counselor. Not working at present; pt counseled  today, he feels well . Due for a UDS but will skip that (currently under [place] [employer], $$) Dyslipidemia, high TG: Encourage good compliance with fish oil. Shingles vaccination: Pro-cons d/w pt regards timing and 2 different vaccines available. He elected to get a prescription for shingrix and is thinking about getting it at his pharmacy. RTC 4-6 months, CPX.

## 2016-11-23 ENCOUNTER — Telehealth: Payer: Self-pay

## 2016-11-23 MED ORDER — ZOLPIDEM TARTRATE 10 MG PO TABS: 10.0000 mg | ORAL_TABLET | Freq: Every evening | ORAL | 5 refills | Status: DC | PRN

## 2016-11-23 NOTE — Telephone Encounter (Signed)
Rx faxed to [facility] pharmacy.  

## 2016-11-23 NOTE — Telephone Encounter (Signed)
Ok 30 and 5

## 2016-11-23 NOTE — Telephone Encounter (Signed)
Pt is requesting refill on Ambien.  Last OV: 11/09/2016 Last Fill: 09/21/2016 #30 and 1RF UDS: Not needed for Ambien per PCP   Please advise.

## 2016-11-23 NOTE — Telephone Encounter (Signed)
Rx printed, awaiting MD signature.  

## 2016-12-13 ENCOUNTER — Telehealth: Payer: Self-pay

## 2016-12-13 MED ORDER — ALPRAZOLAM 0.5 MG PO TABS: 0.5000 mg | ORAL_TABLET | Freq: Every evening | ORAL | 2 refills | Status: DC | PRN

## 2016-12-13 NOTE — Telephone Encounter (Signed)
Rx printed, awaiting MD signature.  

## 2016-12-13 NOTE — Telephone Encounter (Signed)
Pt is requesting refill on Alprazolam.  Last OV: 11/09/2016 Last Fill: 07/12/2016 #30 and 0RF UDS: 02/03/2016 Low risk  Please advise.

## 2016-12-13 NOTE — Telephone Encounter (Signed)
Okay #30, 2 refills

## 2016-12-13 NOTE — Telephone Encounter (Signed)
Rx faxed to [facility] pharmacy.  

## 2017-01-24 ENCOUNTER — Other Ambulatory Visit: Payer: Self-pay | Admitting: Internal Medicine

## 2017-03-23 ENCOUNTER — Encounter: Payer: Self-pay | Admitting: Internal Medicine

## 2017-05-18 ENCOUNTER — Ambulatory Visit (INDEPENDENT_AMBULATORY_CARE_PROVIDER_SITE_OTHER): Payer: 59 | Admitting: Internal Medicine

## 2017-05-18 ENCOUNTER — Encounter: Payer: Self-pay | Admitting: Internal Medicine

## 2017-05-18 VITALS — BP 122/80 | HR 81 | Temp 98.1°F | Resp 14 | Ht 71.0 in | Wt 221.1 lb

## 2017-05-18 DIAGNOSIS — Z0000 Encounter for general adult medical examination without abnormal findings: Secondary | ICD-10-CM

## 2017-05-18 DIAGNOSIS — Z23 Encounter for immunization: Secondary | ICD-10-CM | POA: Diagnosis not present

## 2017-05-18 DIAGNOSIS — Z1159 Encounter for screening for other viral diseases: Secondary | ICD-10-CM | POA: Diagnosis not present

## 2017-05-18 DIAGNOSIS — F419 Anxiety disorder, unspecified: Secondary | ICD-10-CM

## 2017-05-18 NOTE — Progress Notes (Signed)
Subjective:    Patient ID: Joe Blanchard, male    DOB: 1961-03-02, 56 y.o.   MRN: [id number]  DOS:  05/18/2017 Type of visit - description : cpx Interval history:Doing well, no major concerns except that he has gained weight, admits there is a lot of room for improvement on his diet  Wt Readings from Last 3 Encounters:  05/18/17 221 lb 2 oz (100.3 kg)  11/09/16 214 lb 2 oz (97.1 kg)  07/12/16 203 lb 6 oz (92.3 kg)      Review of Systems  A 14 point review of systems is negative    Past Medical History:  Diagnosis Date  . Anxiety   . Arthritis   . Colon polyps    Cscope 11-11, next 06-2011  . Hypertriglyceridemia    a. 11/2013 - 240.  Marland Kitchen Prediabetes 06/24/2011    Past Surgical History:  Procedure Laterality Date  . KNEE SURGERY  12/03/08   scope  . POLYPECTOMY    . SCALP LACERATION REPAIR  ~09-2013    Social History   Social History  . Marital status: Married    Spouse name: N/A  . Number of children: 2  . Years of education: N/A   Occupational History  . 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 Smoking status: Never Smoker  . Smokeless tobacco: Never Used  . Alcohol use 1.8 oz/week    3 Glasses of wine per week     Comment: socially   . Drug use: No  . Sexual activity: Yes    Partners: Female   Other Topics Concern  . Not on file   Social History Narrative   Lives w/ wife in [place].          Allergies as of 05/18/2017   No Known Allergies     Medication List       Accurate as of 05/18/17  8:15 AM. Always use your most recent med list.          acetaminophen 500 MG tablet Commonly known as:  TYLENOL Take 500 mg by mouth every 6 (six) hours as needed. Reported on 09/23/2015   ALPRAZolam 0.5 MG tablet Commonly known as:  XANAX Take 1 tablet (0.5 mg total) by mouth at bedtime as needed.   aspirin EC 81 MG tablet Take 1 tablet (81 mg total) by mouth daily.   DULoxetine 60 MG capsule Commonly known  as:  CYMBALTA Take 1 capsule (60 mg total) by mouth daily.   FISH OIL PO Take 1 capsule by mouth daily.   zolpidem 10 MG tablet Commonly known as:  AMBIEN Take 1 tablet (10 mg total) by mouth at bedtime as needed for sleep.          Objective:   Physical Exam BP 122/80 (BP Location: Left Arm, Patient Position: Sitting, Cuff Size: Small)   Pulse 81   Temp 98.1 F (36.7 C) (Oral)   Resp 14   Ht 5\' 11"  (1.803 m)   Wt 221 lb 2 oz (100.3 kg)   SpO2 97%   BMI 30.84 kg/m  General:   Well developed, well nourished . NAD.  Neck: No  thyromegaly  HEENT:  Normocephalic . Face symmetric, atraumatic Lungs:  CTA B Normal respiratory effort, no intercostal retractions, no accessory muscle use. Heart: RRR,  no murmur.  No pretibial edema bilaterally  Abdomen:  Not distended, soft, non-tender. No rebound or rigidity.   Skin:  Exposed areas without rash. Not pale. Not jaundice Neurologic:  alert & oriented X3.  Speech normal, gait appropriate for age and unassisted Strength symmetric and appropriate for age.  Psych: Cognition and judgment appear intact.  Cooperative with normal attention span and concentration.  Behavior appropriate. No anxious or depressed appearing.     Assessment & Plan:    Assessment: Prediabetes Dyslipidemia, high TG Anxiety 2012: Rx citalopram --> switch to prozac 09-2012 d/t fatigue and no improvment, switch to cymbalta 08-2014 w/ great results Insomnia (on ambien and/or xanax prn) Hypogonadism- saw endo 2014, took clomid, T level normalized but pt d/c meds as he did not feel subjectively better  Chest pain, stress test -2015  PLAN Prediabetes: Check an A1c, he has gained weight. Dyslipidemia:Checking labs Anxiety, insomnia: On Cymbalta, Xanax prn and Ambien; he is not back working, essentially retired for now, stress decreased. Doing well emotionally. RF as needed, UDS RTC 6 months depending on results or 1 year CPX.

## 2017-05-18 NOTE — Assessment & Plan Note (Addendum)
-  Td 2012, had shingrix #1 @ the pharmacy Flu shot today - (+) FH of prostate cancer -->  normal DRE 2017, check a  PSA - (+)  FH colon cancer, Cscope 11-11 and 08-2011; cscope 06-2015 neg, recheck 5 years per report. -Has gained weight, counseled: He is active but admits that he is probably over eating. I recommend to continue physical activity, calorie counting? -Labs : CMP, FLP, CBC, A1c, TSH, PSA, hep C, UDS

## 2017-05-18 NOTE — Progress Notes (Signed)
Pre visit review using our clinic review tool, if applicable. No additional management support is needed unless otherwise documented below in the visit note. 

## 2017-05-18 NOTE — Patient Instructions (Signed)
GO TO THE LAB : Get the blood work     GO TO THE FRONT DESK Schedule your next appointment for 6 months to a year (physical exam) depending on your results.   Consider calorie counting,  MYFITNESSPAL ?

## 2017-05-19 LAB — COMPREHENSIVE METABOLIC PANEL
A/G RATIO: 2.2 (ref 1.2–2.2)
ALBUMIN: 5 g/dL (ref 3.5–5.5)
ALK PHOS: 74 IU/L (ref 39–117)
ALT: 28 IU/L (ref 0–44)
AST: 21 IU/L (ref 0–40)
BUN / CREAT RATIO: 13 (ref 9–20)
BUN: 14 mg/dL (ref 6–24)
Bilirubin Total: 0.3 mg/dL (ref 0.0–1.2)
CO2: 21 mmol/L (ref 20–29)
CREATININE: 1.04 mg/dL (ref 0.76–1.27)
Calcium: 9.6 mg/dL (ref 8.7–10.2)
Chloride: 101 mmol/L (ref 96–106)
GFR calc Af Amer: 92 mL/min/{1.73_m2} (ref >59)
GFR calc non Af Amer: 80 mL/min/{1.73_m2} (ref >59)
GLOBULIN, TOTAL: 2.3 g/dL (ref 1.5–4.5)
Glucose: 111 mg/dL — ABNORMAL HIGH (ref 65–99)
Potassium: 4.5 mmol/L (ref 3.5–5.2)
SODIUM: 141 mmol/L (ref 134–144)
Total Protein: 7.3 g/dL (ref 6.0–8.5)

## 2017-05-19 LAB — CBC WITH DIFFERENTIAL/PLATELET
BASOS ABS: 0 10*3/uL (ref 0.0–0.2)
BASOS: 0 %
EOS (ABSOLUTE): 0.1 10*3/uL (ref 0.0–0.4)
Eos: 2 %
Hematocrit: 44.3 % (ref 37.5–51.0)
Hemoglobin: 14.5 g/dL (ref 13.0–17.7)
IMMATURE GRANS (ABS): 0 10*3/uL (ref 0.0–0.1)
IMMATURE GRANULOCYTES: 0 %
LYMPHS: 28 %
Lymphocytes Absolute: 1.4 10*3/uL (ref 0.7–3.1)
MCH: 28.7 pg (ref 26.6–33.0)
MCHC: 32.7 g/dL (ref 31.5–35.7)
MCV: 88 fL (ref 79–97)
Monocytes Absolute: 0.4 10*3/uL (ref 0.1–0.9)
Monocytes: 7 %
NEUTROS PCT: 63 %
Neutrophils Absolute: 3.2 10*3/uL (ref 1.4–7.0)
Platelets: 190 10*3/uL (ref 150–379)
RBC: 5.06 x10E6/uL (ref 4.14–5.80)
RDW: 13.5 % (ref 12.3–15.4)
WBC: 5.1 10*3/uL (ref 3.4–10.8)

## 2017-05-19 LAB — LIPID PANEL
CHOL/HDL RATIO: 5.1 ratio — AB (ref 0.0–5.0)
CHOLESTEROL TOTAL: 219 mg/dL — AB (ref 100–199)
HDL: 43 mg/dL (ref >39)
LDL CALC: 128 mg/dL — AB (ref 0–99)
Triglycerides: 242 mg/dL — ABNORMAL HIGH (ref 0–149)
VLDL Cholesterol Cal: 48 mg/dL — ABNORMAL HIGH (ref 5–40)

## 2017-05-19 LAB — HEMOGLOBIN A1C
ESTIMATED AVERAGE GLUCOSE: 123 mg/dL
Hgb A1c MFr Bld: 5.9 % — ABNORMAL HIGH (ref 4.8–5.6)

## 2017-05-19 LAB — PSA: Prostate Specific Ag, Serum: 1 ng/mL (ref 0.0–4.0)

## 2017-05-19 LAB — HEPATITIS C ANTIBODY: Hep C Virus Ab: <0.1 s/co ratio (ref 0.0–0.9)

## 2017-05-19 LAB — TSH: TSH: 4.42 u[IU]/mL (ref 0.450–4.500)

## 2017-05-19 NOTE — Assessment & Plan Note (Signed)
Prediabetes: Check an A1c, he has gained weight. Dyslipidemia:Checking labs Anxiety, insomnia: On Cymbalta, Xanax prn and Ambien; he is not back working, essentially retired for now, stress decreased. Doing well emotionally. RF as needed, UDS RTC 6 months depending on results or 1 year CPX.

## 2017-05-22 ENCOUNTER — Telehealth: Payer: Self-pay | Admitting: *Deleted

## 2017-05-22 NOTE — Telephone Encounter (Signed)
Received results from [facility]; forwarded to provider/SLS 10/08

## 2017-05-23 LAB — PAIN MGMT, PROFILE 8 W/CONF, U
6 Acetylmorphine: NEGATIVE ng/mL (ref <10)
ALPHAHYDROXYTRIAZOLAM: NEGATIVE ng/mL (ref <50)
AMINOCLONAZEPAM: NEGATIVE ng/mL (ref <25)
AMPHETAMINES: NEGATIVE ng/mL (ref <500)
Alcohol Metabolites: NEGATIVE ng/mL (ref <500)
Alphahydroxyalprazolam: 25 ng/mL — ABNORMAL HIGH (ref <25)
Alphahydroxymidazolam: NEGATIVE ng/mL (ref <50)
Benzodiazepines: POSITIVE ng/mL — AB (ref <100)
Buprenorphine, Urine: NEGATIVE ng/mL (ref <5)
Cocaine Metabolite: NEGATIVE ng/mL (ref <150)
Creatinine: 151.2 mg/dL
Hydroxyethylflurazepam: NEGATIVE ng/mL (ref <50)
Lorazepam: NEGATIVE ng/mL (ref <50)
MDMA: NEGATIVE ng/mL (ref <500)
Marijuana Metabolite: NEGATIVE ng/mL (ref <20)
Nordiazepam: NEGATIVE ng/mL (ref <50)
OXAZEPAM: NEGATIVE ng/mL (ref <50)
OXIDANT: NEGATIVE ug/mL (ref <200)
OXYCODONE: NEGATIVE ng/mL (ref <100)
Opiates: NEGATIVE ng/mL (ref <100)
TEMAZEPAM: NEGATIVE ng/mL (ref <50)
pH: 5.27 (ref 4.5–9.0)

## 2017-05-25 ENCOUNTER — Other Ambulatory Visit: Payer: Self-pay | Admitting: Internal Medicine

## 2017-05-25 NOTE — Telephone Encounter (Signed)
Rx printed, awaiting MD signature.  

## 2017-05-25 NOTE — Telephone Encounter (Signed)
Pt is requesting refill on Ambien 10mg .  Last OV: 05/18/2017 Last Fill: 11/23/2016 #30 and 5RF UDS: 05/18/2017 Low risk  NCCR printed; Pt is on alprazolam 0.5mg  and Ambien 10mg . No other issues noted.    Please advise.

## 2017-05-25 NOTE — Telephone Encounter (Signed)
Okay Ambien No. 30 and 5 refills

## 2017-05-25 NOTE — Telephone Encounter (Signed)
Rx faxed to [facility] pharmacy.  

## 2017-06-08 ENCOUNTER — Other Ambulatory Visit: Payer: Self-pay | Admitting: Internal Medicine

## 2017-06-08 NOTE — Telephone Encounter (Signed)
Okay 30 and 3 refills 

## 2017-06-08 NOTE — Telephone Encounter (Signed)
Rx faxed to [facility] pharmacy.  

## 2017-06-08 NOTE — Telephone Encounter (Signed)
Pt is requesting refill on alprazolam 0.5mg .  Last OV: 05/18/2017 Last Fill: 12/13/2016 #30 and 2RF UDS: 05/18/2017 Low risk  NCCR printed 06/04/2017; no issues noted  Please advise.

## 2017-06-08 NOTE — Telephone Encounter (Signed)
Rx printed, awaiting MD signature.  

## 2017-07-28 ENCOUNTER — Other Ambulatory Visit: Payer: Self-pay | Admitting: Internal Medicine

## 2017-11-26 ENCOUNTER — Telehealth: Payer: Self-pay | Admitting: Internal Medicine

## 2017-11-27 NOTE — Telephone Encounter (Signed)
Pt is requesting refill on Ambien 10mg .   Last OV: 05/18/2017 Last Fill: 05/25/2017 #30 and 5rf UDS: 05/18/2017 Low risk  NCCR printed- no discrepancies noted- sent for scanning.   Please advise.

## 2017-11-27 NOTE — Telephone Encounter (Signed)
Sent!

## 2018-02-16 ENCOUNTER — Telehealth: Payer: Self-pay | Admitting: Internal Medicine

## 2018-02-16 NOTE — Telephone Encounter (Signed)
Sent!

## 2018-02-16 NOTE — Telephone Encounter (Signed)
Pt is requesting refill on alprazolam.  Last OV: 05/18/2017 Last Fill: 06/08/2017 #30 and 3RF UDS: 05/18/2017 Low risk  NCCR printed- no discrepancies noted- sent for scanning  Please advise.

## 2018-05-09 ENCOUNTER — Other Ambulatory Visit: Payer: Self-pay | Admitting: Internal Medicine

## 2018-05-16 DIAGNOSIS — M170 Bilateral primary osteoarthritis of knee: Secondary | ICD-10-CM | POA: Diagnosis not present

## 2018-05-16 DIAGNOSIS — M25562 Pain in left knee: Secondary | ICD-10-CM | POA: Diagnosis not present

## 2018-05-30 ENCOUNTER — Encounter: Payer: 59 | Admitting: Internal Medicine

## 2018-06-04 ENCOUNTER — Encounter: Payer: Self-pay | Admitting: Internal Medicine

## 2018-06-04 ENCOUNTER — Ambulatory Visit (INDEPENDENT_AMBULATORY_CARE_PROVIDER_SITE_OTHER): Payer: BLUE CROSS/BLUE SHIELD | Admitting: Internal Medicine

## 2018-06-04 VITALS — BP 132/70 | HR 87 | Temp 98.7°F | Resp 16 | Ht 68.0 in | Wt 221.5 lb

## 2018-06-04 DIAGNOSIS — Z79899 Other long term (current) drug therapy: Secondary | ICD-10-CM | POA: Diagnosis not present

## 2018-06-04 DIAGNOSIS — Z23 Encounter for immunization: Secondary | ICD-10-CM

## 2018-06-04 DIAGNOSIS — R739 Hyperglycemia, unspecified: Secondary | ICD-10-CM | POA: Diagnosis not present

## 2018-06-04 DIAGNOSIS — Z125 Encounter for screening for malignant neoplasm of prostate: Secondary | ICD-10-CM | POA: Diagnosis not present

## 2018-06-04 DIAGNOSIS — F419 Anxiety disorder, unspecified: Secondary | ICD-10-CM

## 2018-06-04 DIAGNOSIS — Z0000 Encounter for general adult medical examination without abnormal findings: Secondary | ICD-10-CM

## 2018-06-04 LAB — HEMOGLOBIN A1C: HEMOGLOBIN A1C: 6.1 % (ref 4.6–6.5)

## 2018-06-04 LAB — CBC WITH DIFFERENTIAL/PLATELET
BASOS PCT: 0.7 % (ref 0.0–3.0)
Basophils Absolute: 0 10*3/uL (ref 0.0–0.1)
EOS ABS: 0.1 10*3/uL (ref 0.0–0.7)
EOS PCT: 1.2 % (ref 0.0–5.0)
HCT: 45.2 % (ref 39.0–52.0)
HEMOGLOBIN: 15.4 g/dL (ref 13.0–17.0)
LYMPHS ABS: 1.5 10*3/uL (ref 0.7–4.0)
Lymphocytes Relative: 26.6 % (ref 12.0–46.0)
MCHC: 34 g/dL (ref 30.0–36.0)
MCV: 86.5 fl (ref 78.0–100.0)
MONO ABS: 0.3 10*3/uL (ref 0.1–1.0)
Monocytes Relative: 6.3 % (ref 3.0–12.0)
NEUTROS PCT: 65.2 % (ref 43.0–77.0)
Neutro Abs: 3.6 10*3/uL (ref 1.4–7.7)
Platelets: 181 10*3/uL (ref 150.0–400.0)
RBC: 5.23 Mil/uL (ref 4.22–5.81)
RDW: 13.8 % (ref 11.5–15.5)
WBC: 5.5 10*3/uL (ref 4.0–10.5)

## 2018-06-04 LAB — COMPREHENSIVE METABOLIC PANEL
ALBUMIN: 4.7 g/dL (ref 3.5–5.2)
ALT: 33 U/L (ref 0–53)
AST: 19 U/L (ref 0–37)
Alkaline Phosphatase: 65 U/L (ref 39–117)
BUN: 15 mg/dL (ref 6–23)
CALCIUM: 9.8 mg/dL (ref 8.4–10.5)
CHLORIDE: 103 meq/L (ref 96–112)
CO2: 27 mEq/L (ref 19–32)
Creatinine, Ser: 0.97 mg/dL (ref 0.40–1.50)
GFR: 84.67 mL/min (ref >60.00)
Glucose, Bld: 101 mg/dL — ABNORMAL HIGH (ref 70–99)
POTASSIUM: 4.3 meq/L (ref 3.5–5.1)
Sodium: 138 mEq/L (ref 135–145)
Total Bilirubin: 0.6 mg/dL (ref 0.2–1.2)
Total Protein: 7.3 g/dL (ref 6.0–8.3)

## 2018-06-04 LAB — TSH: TSH: 3.05 u[IU]/mL (ref 0.35–4.50)

## 2018-06-04 LAB — LIPID PANEL
CHOL/HDL RATIO: 6
CHOLESTEROL: 238 mg/dL — AB (ref 0–200)
HDL: 40.2 mg/dL (ref >39.00)
NonHDL: 197.6
TRIGLYCERIDES: 310 mg/dL — AB (ref 0.0–149.0)
VLDL: 62 mg/dL — AB (ref 0.0–40.0)

## 2018-06-04 LAB — LDL CHOLESTEROL, DIRECT: Direct LDL: 143 mg/dL

## 2018-06-04 LAB — PSA: PSA: 0.97 ng/mL (ref 0.10–4.00)

## 2018-06-04 NOTE — Patient Instructions (Signed)
GO TO THE LAB : Get the blood work     GO TO THE FRONT DESK Schedule your next appointment for a  Physical exam in 1 year 

## 2018-06-04 NOTE — Assessment & Plan Note (Signed)
-  Td 2012, had shingrix x 2 . Flu shot today - (+) FH of prostate cancer -->  normal DRE today, check a  PSA - (+)  FH colon cancer, Cscope 11-11 and 08-2011; cscope 06-2015 neg, recheck 5 years per report. -Labs : CMP, FLP's, CBC, A1c, TSH, PSA, UDS -- Exercise limited by DJD, information about the weight management offices in the area provided.

## 2018-06-04 NOTE — Progress Notes (Signed)
Subjective:    Patient ID: Joe Blanchard, male    DOB: Sep 05, 1960, 57 y.o.   MRN: [id number]  DOS:  06/04/2018 Type of visit - description : cpx Interval history: In general feeling well.  Has some difficulty losing weight   Review of Systems Continue with pain at the knees, occasional effusion, follow-up by Ortho.  Other than above, a 14 point review of systems is negative    Past Medical History:  Diagnosis Date  . Anxiety   . Arthritis   . Colon polyps    Cscope 11-11, next 06-2011  . Hypertriglyceridemia    a. 11/2013 - 240.  Marland Kitchen Prediabetes 06/24/2011    Past Surgical History:  Procedure Laterality Date  . KNEE SURGERY  12/03/08   scope  . POLYPECTOMY    . SCALP LACERATION REPAIR  ~09-2013    Social History   Socioeconomic History  . Marital status: Married    Spouse name: Not on file  . Number of children: 2  . Years of education: Not on file  . Highest education level: Not on file  Occupational History  . 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 Occupation: retired   Scientific laboratory technician  . Financial resource strain: Not on file  . Food insecurity:    Worry: Not on file    Inability: Not on file  . Transportation needs:    Medical: Not on file    Non-medical: Not on file  Tobacco Use  . Smoking status: Never Smoker  . Smokeless tobacco: Never Used  Substance and Sexual Activity  . Alcohol use: Yes    Alcohol/week: 3.0 standard drinks    Types: 3 Glasses of wine per week    Comment: socially   . Drug use: No  . Sexual activity: Yes    Partners: Female  Lifestyle  . Physical activity:    Days per week: Not on file    Minutes per session: Not on file  . Stress: Not on file  Relationships  . Social connections:    Talks on phone: Not on file    Gets together: Not on file    Attends religious service: Not on file    Active member of club or organization: Not on file    Attends meetings of clubs or organizations:  Not on file    Relationship status: Not on file  . Intimate partner violence:    Fear of current or ex partner: Not on file    Emotionally abused: Not on file    Physically abused: Not on file    Forced sexual activity: Not on file  Other Topics Concern  . Not on file  Social History Narrative   Lives w/ wife in [place].       1 child at home     Family History  Problem Relation Age of Onset  . Colon cancer Father        dx in his 70s  . Prostate cancer Father        dx in his 4s?  . Other Father        died suddenly @ 52.  . Lupus Sister        anticoagulant  . Heart disease Other        GF  . Colon polyps Other   . Other Mother        alive & well in her 24's.  . Dementia Mother   .  Diabetes Neg Hx      Allergies as of 06/04/2018   No Known Allergies     Medication List        Accurate as of 06/04/18 11:59 PM. Always use your most recent med list.          acetaminophen 500 MG tablet Commonly known as:  TYLENOL Take 500 mg by mouth every 6 (six) hours as needed. Reported on 09/23/2015   ALPRAZolam 0.5 MG tablet Commonly known as:  XANAX TAKE 1 TABLET BY MOUTH AT BEDTIME AS NEEDED   aspirin EC 81 MG tablet Take 1 tablet (81 mg total) by mouth daily.   DULoxetine 60 MG capsule Commonly known as:  CYMBALTA Take 1 capsule (60 mg total) by mouth daily.   FISH OIL PO Take 1 capsule by mouth daily.   zolpidem 10 MG tablet Commonly known as:  AMBIEN Take 1 tablet (10 mg total) by mouth at bedtime as needed for sleep.          Objective:   Physical Exam BP 132/70 (BP Location: Left Arm, Patient Position: Sitting, Cuff Size: Small)   Pulse 87   Temp 98.7 F (37.1 C) (Oral)   Resp 16   Ht 5\' 8"  (1.727 m)   Wt 221 lb 8 oz (100.5 kg)   SpO2 97%   BMI 33.68 kg/m  General: Well developed, NAD, see BMI.  Neck: No  thyromegaly  HEENT:  Normocephalic . Face symmetric, atraumatic Lungs:  CTA B Normal respiratory effort, no intercostal retractions,  no accessory muscle use. Heart: RRR,  no murmur.  No pretibial edema bilaterally  Abdomen:  Not distended, soft, non-tender. No rebound or rigidity.   Rectal: External abnormalities: none. Normal sphincter tone. No rectal masses or tenderness.  Brown stools Prostate: Prostate gland firm and smooth, no enlargement, nodularity, tenderness, mass, asymmetry or induration Skin: Exposed areas without rash. Not pale. Not jaundice Neurologic:  alert & oriented X3.  Speech normal, gait appropriate for age and unassisted Strength symmetric and appropriate for age.  Psych: Cognition and judgment appear intact.  Cooperative with normal attention span and concentration.  Behavior appropriate. No anxious or depressed appearing.     Assessment & Plan:   Assessment: Prediabetes Dyslipidemia, high TG DJD: Mostly at the knees Anxiety 2012: Rx citalopram --> switch to prozac 09-2012 d/t fatigue and no improvment, switch to cymbalta 08-2014 w/ great results Insomnia (on ambien and/or xanax prn) Hypogonadism- saw endo 2014, took clomid, T level normalized but pt d/c meds as he did not feel subjectively better  Chest pain, stress test -2015  PLAN Prediabetes: Diet controlled, check a A1c Dyslipidemia: Diet controlled, checking labs Anxiety: Continue Cymbalta, Xanax Insomnia: On Ambien. UDS and contract today DJD: Follow-up by Ortho, at some point will need knee replacements. RTC 1 year CPX

## 2018-06-04 NOTE — Progress Notes (Signed)
Pre visit review using our clinic review tool, if applicable. No additional management support is needed unless otherwise documented below in the visit note. 

## 2018-06-05 NOTE — Assessment & Plan Note (Signed)
Prediabetes: Diet controlled, check a A1c Dyslipidemia: Diet controlled, checking labs Anxiety: Continue Cymbalta, Xanax Insomnia: On Ambien. UDS and contract today DJD: Follow-up by Ortho, at some point will need knee replacements. RTC 1 year CPX

## 2018-06-06 ENCOUNTER — Encounter: Payer: Self-pay | Admitting: Internal Medicine

## 2018-06-06 LAB — PAIN MGMT, PROFILE 8 W/CONF, U
6 Acetylmorphine: NEGATIVE ng/mL (ref <10)
ALCOHOL METABOLITES: NEGATIVE ng/mL (ref <500)
ALPHAHYDROXYALPRAZOLAM: 49 ng/mL — AB (ref <25)
ALPHAHYDROXYMIDAZOLAM: NEGATIVE ng/mL (ref <50)
Alphahydroxytriazolam: NEGATIVE ng/mL (ref <50)
Aminoclonazepam: NEGATIVE ng/mL (ref <25)
Amphetamines: NEGATIVE ng/mL (ref <500)
Benzodiazepines: POSITIVE ng/mL — AB (ref <100)
Buprenorphine, Urine: NEGATIVE ng/mL (ref <5)
Cocaine Metabolite: NEGATIVE ng/mL (ref <150)
Creatinine: 167.9 mg/dL
HYDROXYETHYLFLURAZEPAM: NEGATIVE ng/mL (ref <50)
Lorazepam: NEGATIVE ng/mL (ref <50)
MARIJUANA METABOLITE: NEGATIVE ng/mL (ref <20)
MDMA: NEGATIVE ng/mL (ref <500)
NORDIAZEPAM: NEGATIVE ng/mL (ref <50)
OPIATES: NEGATIVE ng/mL (ref <100)
OXYCODONE: NEGATIVE ng/mL (ref <100)
Oxazepam: NEGATIVE ng/mL (ref <50)
Oxidant: NEGATIVE ug/mL (ref <200)
Temazepam: NEGATIVE ng/mL (ref <50)
pH: 5.5 (ref 4.5–9.0)

## 2018-06-12 ENCOUNTER — Telehealth: Payer: Self-pay | Admitting: Internal Medicine

## 2018-06-12 NOTE — Telephone Encounter (Signed)
Sent!

## 2018-06-12 NOTE — Telephone Encounter (Signed)
Pt is requesting refill on Ambien.   Last OV: 06/04/2018 Last Fill: 02/16/2018 #30 and 3RF UDS: 06/04/2018 Low risk  NCCR printed- no discrepancies noted- sent for scanning

## 2018-07-15 ENCOUNTER — Other Ambulatory Visit: Payer: Self-pay | Admitting: Internal Medicine

## 2018-08-20 ENCOUNTER — Telehealth: Payer: Self-pay | Admitting: Internal Medicine

## 2018-08-20 NOTE — Telephone Encounter (Signed)
Pt is requesting refill on alprazolam.   Last OV: 06/04/2018 Last Fill: 02/16/2018 #30 and 3RF UDS: 02/03/2016 Low risk  NCCR in media from 06/12/2018

## 2018-08-20 NOTE — Telephone Encounter (Signed)
sent 

## 2019-01-02 ENCOUNTER — Telehealth: Payer: Self-pay | Admitting: Internal Medicine

## 2019-01-02 NOTE — Telephone Encounter (Signed)
Sent!

## 2019-01-02 NOTE — Telephone Encounter (Signed)
Ambien refill request.   Last OV: 06/04/2018 Last Fill: 06/12/2018 #30 and 5RF UDS: 06/04/2018 Low risk

## 2019-05-10 ENCOUNTER — Telehealth: Payer: Self-pay | Admitting: Internal Medicine

## 2019-05-10 NOTE — Telephone Encounter (Signed)
Sent!

## 2019-05-10 NOTE — Telephone Encounter (Signed)
Alprazolam refill.   Last OV: 06/04/2018, appt scheduled 06/11/2019 Last Fill: 08/20/2018 #30 and 4RF UDS: 06/04/2018 Low risk

## 2019-06-10 ENCOUNTER — Telehealth: Payer: Self-pay | Admitting: Internal Medicine

## 2019-06-10 ENCOUNTER — Other Ambulatory Visit: Payer: Self-pay

## 2019-06-10 NOTE — Telephone Encounter (Signed)
Sent!

## 2019-06-10 NOTE — Telephone Encounter (Signed)
Ambien refill.   Last OV: 06/04/2018, appt scheduled 06/11/2019 Last Fill: 01/02/2019 #30 and 4rf UDS: 06/04/2018 Low risk

## 2019-06-11 ENCOUNTER — Encounter: Payer: Self-pay | Admitting: Internal Medicine

## 2019-06-11 ENCOUNTER — Other Ambulatory Visit: Payer: Self-pay

## 2019-06-11 ENCOUNTER — Ambulatory Visit (INDEPENDENT_AMBULATORY_CARE_PROVIDER_SITE_OTHER): Payer: BC Managed Care – PPO | Admitting: Internal Medicine

## 2019-06-11 VITALS — BP 130/87 | HR 81 | Temp 96.8°F | Resp 16 | Ht 68.0 in | Wt 226.4 lb

## 2019-06-11 DIAGNOSIS — Z0000 Encounter for general adult medical examination without abnormal findings: Secondary | ICD-10-CM | POA: Diagnosis not present

## 2019-06-11 DIAGNOSIS — E785 Hyperlipidemia, unspecified: Secondary | ICD-10-CM | POA: Diagnosis not present

## 2019-06-11 DIAGNOSIS — Z79899 Other long term (current) drug therapy: Secondary | ICD-10-CM | POA: Diagnosis not present

## 2019-06-11 DIAGNOSIS — R739 Hyperglycemia, unspecified: Secondary | ICD-10-CM | POA: Diagnosis not present

## 2019-06-11 DIAGNOSIS — F419 Anxiety disorder, unspecified: Secondary | ICD-10-CM | POA: Diagnosis not present

## 2019-06-11 DIAGNOSIS — Z23 Encounter for immunization: Secondary | ICD-10-CM | POA: Diagnosis not present

## 2019-06-11 NOTE — Progress Notes (Signed)
Pre visit review using our clinic review tool, if applicable. No additional management support is needed unless otherwise documented below in the visit note. 

## 2019-06-11 NOTE — Patient Instructions (Signed)
GO TO THE LAB : Get the blood work     GO TO THE FRONT DESK Schedule your next appointment   for physical exam in 1 year    Please   consider decrease your portion size when you eat  You may like to use an app called "My fitness pal"

## 2019-06-11 NOTE — Progress Notes (Signed)
Subjective:    Patient ID: Joe Blanchard, male    DOB: 02/03/1961, 58 y.o.   MRN: [id number]  DOS:  06/11/2019 Type of visit - description: CPX Since the last office visit he is doing well. Good compliance with medication Still has DJD pains, takes occasional Tylenol.  Wt Readings from Last 3 Encounters:  06/11/19 226 lb 6 oz (102.7 kg)  06/04/18 221 lb 8 oz (100.5 kg)  05/18/17 221 lb 2 oz (100.3 kg)     Review of Systems Other than above, a 14 point review of systems is negative     Past Medical History:  Diagnosis Date  . Anxiety   . Arthritis   . Colon polyps    Cscope 11-11, next 06-2011  . Hypertriglyceridemia    a. 11/2013 - 240.  Marland Kitchen Prediabetes 06/24/2011    Past Surgical History:  Procedure Laterality Date  . KNEE SURGERY  12/03/08   scope  . POLYPECTOMY    . SCALP LACERATION REPAIR  ~09-2013    Social History   Socioeconomic History  . Marital status: Married    Spouse name: Not on file  . Number of children: 2  . Years of education: Not on file  . Highest education level: Not on file  Occupational History  . 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 Occupation: retired   Scientific laboratory technician  . Financial resource strain: Not on file  . Food insecurity    Worry: Not on file    Inability: Not on file  . Transportation needs    Medical: Not on file    Non-medical: Not on file  Tobacco Use  . Smoking status: Never Smoker  . Smokeless tobacco: Never Used  Substance and Sexual Activity  . Alcohol use: Yes    Alcohol/week: 3.0 standard drinks    Types: 3 Glasses of wine per week    Comment: socially   . Drug use: No  . Sexual activity: Yes    Partners: Female  Lifestyle  . Physical activity    Days per week: Not on file    Minutes per session: Not on file  . Stress: Not on file  Relationships  . Social Herbalist on phone: Not on file    Gets together: Not on file    Attends religious service: Not  on file    Active member of club or organization: Not on file    Attends meetings of clubs or organizations: Not on file    Relationship status: Not on file  . Intimate partner violence    Fear of current or ex partner: Not on file    Emotionally abused: Not on file    Physically abused: Not on file    Forced sexual activity: Not on file  Other Topics Concern  . Not on file  Social History Narrative   Lives w/ wife in [place].       1 child at home     Family History  Problem Relation Age of Onset  . Colon cancer Father        dx in his 51s  . Prostate cancer Father        dx in his 46s?  . Other Father        died suddenly @ 43.  . Lupus Sister        anticoagulant  . Heart disease Other  GF  . Colon polyps Other   . Other Mother        alive & well in her 48's.  . Dementia Mother   . Diabetes Neg Hx      Allergies as of 06/11/2019   No Known Allergies     Medication List       Accurate as of June 11, 2019 11:59 PM. If you have any questions, ask your nurse or doctor.        acetaminophen 500 MG tablet Commonly known as: TYLENOL Take 500 mg by mouth every 6 (six) hours as needed. Reported on 09/23/2015   ALPRAZolam 0.5 MG tablet Commonly known as: XANAX TAKE ONE TABLET BY MOUTH AT BEDTIME AS NEEDED   aspirin EC 81 MG tablet Take 1 tablet (81 mg total) by mouth daily.   DULoxetine 60 MG capsule Commonly known as: CYMBALTA Take 1 capsule (60 mg total) by mouth daily.   FISH OIL PO Take 1 capsule by mouth daily.   zolpidem 10 MG tablet Commonly known as: AMBIEN TAKE ONE TABLET BY MOUTH AT BEDTIME AS NEEDED FOR SLEEP           Objective:   Physical Exam BP 130/87 (BP Location: Left Arm, Patient Position: Sitting, Cuff Size: Normal)   Pulse 81   Temp (!) 96.8 F (36 C) (Temporal)   Resp 16   Ht 5\' 8"  (1.727 m)   Wt 226 lb 6 oz (102.7 kg)   SpO2 98%   BMI 34.42 kg/m  General: Well developed, NAD, BMI noted Neck: No  thyromegaly   HEENT:  Normocephalic . Face symmetric, atraumatic Lungs:  CTA B Normal respiratory effort, no intercostal retractions, no accessory muscle use. Heart: RRR,  no murmur.  No pretibial edema bilaterally  Abdomen:  Not distended, soft, non-tender. No rebound or rigidity.   Skin: Exposed areas without rash. Not pale. Not jaundice Neurologic:  alert & oriented X3.  Speech normal, gait appropriate for age and unassisted Strength symmetric and appropriate for age.  Psych: Cognition and judgment appear intact.  Cooperative with normal attention span and concentration.  Behavior appropriate. No anxious or depressed appearing.     Assessment     Assessment: Prediabetes Dyslipidemia, high TG DJD: Mostly at the knees Anxiety 2012: Rx citalopram --> switch to prozac 09-2012 d/t fatigue and no improvment, switch to cymbalta 08-2014 w/ great results Insomnia (on ambien and/or xanax prn) Hypogonadism- saw endo 2014, took clomid, T level normalized but pt d/c meds as he did not feel subjectively better  Chest pain, stress test -2015  PLAN For CPX Prediabetes: Diet and exercise discussed.  Check A1c Dyslipidemia: Checking labs Anxiety, insomnia: Taking Ambien most nights, occasional Xanax.  Also on Cymbalta.  UDS today.  RF as needed RTC 1 year

## 2019-06-12 LAB — LIPID PANEL
Chol/HDL Ratio: 6.9 ratio — ABNORMAL HIGH (ref 0.0–5.0)
Cholesterol, Total: 270 mg/dL — ABNORMAL HIGH (ref 100–199)
HDL: 39 mg/dL — ABNORMAL LOW (ref >39)
LDL Chol Calc (NIH): 175 mg/dL — ABNORMAL HIGH (ref 0–99)
Triglycerides: 289 mg/dL — ABNORMAL HIGH (ref 0–149)
VLDL Cholesterol Cal: 56 mg/dL — ABNORMAL HIGH (ref 5–40)

## 2019-06-12 LAB — COMPREHENSIVE METABOLIC PANEL
ALT: 49 IU/L — ABNORMAL HIGH (ref 0–44)
AST: 29 IU/L (ref 0–40)
Albumin/Globulin Ratio: 2.1 (ref 1.2–2.2)
Albumin: 4.8 g/dL (ref 3.8–4.9)
Alkaline Phosphatase: 89 IU/L (ref 39–117)
BUN/Creatinine Ratio: 16 (ref 9–20)
BUN: 14 mg/dL (ref 6–24)
Bilirubin Total: 0.5 mg/dL (ref 0.0–1.2)
CO2: 21 mmol/L (ref 20–29)
Calcium: 9.8 mg/dL (ref 8.7–10.2)
Chloride: 102 mmol/L (ref 96–106)
Creatinine, Ser: 0.9 mg/dL (ref 0.76–1.27)
GFR calc Af Amer: 108 mL/min/{1.73_m2} (ref >59)
GFR calc non Af Amer: 94 mL/min/{1.73_m2} (ref >59)
Globulin, Total: 2.3 g/dL (ref 1.5–4.5)
Glucose: 103 mg/dL — ABNORMAL HIGH (ref 65–99)
Potassium: 4.3 mmol/L (ref 3.5–5.2)
Sodium: 139 mmol/L (ref 134–144)
Total Protein: 7.1 g/dL (ref 6.0–8.5)

## 2019-06-12 LAB — CBC WITH DIFFERENTIAL/PLATELET
Basophils Absolute: 0 10*3/uL (ref 0.0–0.2)
Basos: 1 %
EOS (ABSOLUTE): 0.1 10*3/uL (ref 0.0–0.4)
Eos: 1 %
Hematocrit: 44.5 % (ref 37.5–51.0)
Hemoglobin: 15.3 g/dL (ref 13.0–17.7)
Immature Grans (Abs): 0 10*3/uL (ref 0.0–0.1)
Immature Granulocytes: 1 %
Lymphocytes Absolute: 1.5 10*3/uL (ref 0.7–3.1)
Lymphs: 26 %
MCH: 29.2 pg (ref 26.6–33.0)
MCHC: 34.4 g/dL (ref 31.5–35.7)
MCV: 85 fL (ref 79–97)
Monocytes Absolute: 0.4 10*3/uL (ref 0.1–0.9)
Monocytes: 8 %
Neutrophils Absolute: 3.6 10*3/uL (ref 1.4–7.0)
Neutrophils: 63 %
Platelets: 190 10*3/uL (ref 150–450)
RBC: 5.24 x10E6/uL (ref 4.14–5.80)
RDW: 13.1 % (ref 11.6–15.4)
WBC: 5.6 10*3/uL (ref 3.4–10.8)

## 2019-06-12 LAB — HEMOGLOBIN A1C
Est. average glucose Bld gHb Est-mCnc: 123 mg/dL
Hgb A1c MFr Bld: 5.9 % — ABNORMAL HIGH (ref 4.8–5.6)

## 2019-06-12 NOTE — Assessment & Plan Note (Signed)
-  Td 2012 - had shingrix x 2 - Flu shot today - (+) FH of prostate cancer -->  normal DRE and PSA 2019 - (+)  FH colon cancer, Cscope 11-11 and 08-2011; cscope 06-2015 neg, recheck 5 years per report. - Labs: CMP, FLP, CBC, A1c   -- Exercise limited by DJD, long discussion about diet.  Calorie counting?  -We also calculated his 10-year cardiovascular risk: 10.5%

## 2019-06-12 NOTE — Assessment & Plan Note (Signed)
For CPX Prediabetes: Diet and exercise discussed.  Check A1c Dyslipidemia: Checking labs Anxiety, insomnia: Taking Ambien most nights, occasional Xanax.  Also on Cymbalta.  UDS today.  RF as needed RTC 1 year

## 2019-06-15 LAB — TOXASSURE SELECT 13 (MW), URINE

## 2019-06-19 ENCOUNTER — Ambulatory Visit: Payer: Self-pay

## 2019-06-19 NOTE — Telephone Encounter (Signed)
Call returned to patient left VM to return call to office.

## 2019-06-19 NOTE — Telephone Encounter (Signed)
Attempted to return call received from patient today to review lab note of Dr Larose Kells on 06/11/2019. Left VM to return call to office.

## 2019-06-24 MED ORDER — ATORVASTATIN CALCIUM 20 MG PO TABS: 20.0000 mg | ORAL_TABLET | Freq: Every day | ORAL | 0 refills | Status: DC

## 2019-06-24 NOTE — Addendum Note (Signed)
Addended byDamita Dunnings D on: 06/24/2019 01:38 PM   Modules accepted: Orders

## 2019-07-02 ENCOUNTER — Telehealth: Payer: Self-pay | Admitting: Internal Medicine

## 2019-07-02 NOTE — Telephone Encounter (Signed)
Called patient to [facility] for lab no answer . Patient needs just  labs.  IN 5 WEEKS

## 2019-07-19 ENCOUNTER — Encounter: Payer: Self-pay | Admitting: Internal Medicine

## 2019-08-02 ENCOUNTER — Other Ambulatory Visit: Payer: Self-pay | Admitting: Internal Medicine

## 2019-08-02 NOTE — Telephone Encounter (Signed)
Last OV 06/11/19 Last refill 07/16/18 #90/3 Next OV 06/17/20

## 2019-08-05 ENCOUNTER — Other Ambulatory Visit (INDEPENDENT_AMBULATORY_CARE_PROVIDER_SITE_OTHER): Payer: BC Managed Care – PPO

## 2019-08-05 ENCOUNTER — Other Ambulatory Visit: Payer: Self-pay

## 2019-08-05 DIAGNOSIS — E785 Hyperlipidemia, unspecified: Secondary | ICD-10-CM | POA: Diagnosis not present

## 2019-08-05 NOTE — Addendum Note (Signed)
Addended by: Caffie Pinto on: 08/05/2019 07:35 AM   Modules accepted: Orders

## 2019-08-06 LAB — LIPID PANEL
Chol/HDL Ratio: 3.7 ratio (ref 0.0–5.0)
Cholesterol, Total: 161 mg/dL (ref 100–199)
HDL: 44 mg/dL (ref >39)
LDL Chol Calc (NIH): 74 mg/dL (ref 0–99)
Triglycerides: 263 mg/dL — ABNORMAL HIGH (ref 0–149)
VLDL Cholesterol Cal: 43 mg/dL — ABNORMAL HIGH (ref 5–40)

## 2019-08-06 LAB — ALT: ALT: 36 IU/L (ref 0–44)

## 2019-08-06 LAB — AST: AST: 25 IU/L (ref 0–40)

## 2019-08-08 MED ORDER — ATORVASTATIN CALCIUM 20 MG PO TABS: 20.0000 mg | ORAL_TABLET | Freq: Every day | ORAL | 3 refills | Status: DC

## 2019-08-08 NOTE — Addendum Note (Signed)
Addended byDamita Dunnings D on: 08/08/2019 09:05 AM   Modules accepted: Orders

## 2019-08-10 ENCOUNTER — Other Ambulatory Visit: Payer: Self-pay | Admitting: Internal Medicine

## 2019-08-13 ENCOUNTER — Encounter: Payer: Self-pay | Admitting: Internal Medicine

## 2019-08-13 NOTE — Telephone Encounter (Signed)
Sent!

## 2019-08-13 NOTE — Telephone Encounter (Signed)
See rx request encounter.

## 2019-10-21 DIAGNOSIS — Z03818 Encounter for observation for suspected exposure to other biological agents ruled out: Secondary | ICD-10-CM | POA: Diagnosis not present

## 2019-11-29 ENCOUNTER — Telehealth: Payer: Self-pay | Admitting: Internal Medicine

## 2019-11-29 NOTE — Telephone Encounter (Signed)
Requesting: xanax Contract:10.31.2019 UDS:10.21.19 Last Visit:06/11/2019 Next Visit:06/17/20 Last Refill:05/10/19  Please Advise

## 2019-12-02 NOTE — Telephone Encounter (Signed)
Prescription sent, PDMP okay 

## 2019-12-10 ENCOUNTER — Telehealth: Payer: Self-pay | Admitting: Internal Medicine

## 2019-12-10 NOTE — Telephone Encounter (Signed)
Ambien refill.   Last OV: 06/11/2019 Last Fill: 08/13/2019 #30 and 3RF Pt sig: 1 tab qhs prn UDS: 06/11/2019 Low risk

## 2019-12-10 NOTE — Telephone Encounter (Signed)
Rx sent 

## 2019-12-18 DIAGNOSIS — Z1283 Encounter for screening for malignant neoplasm of skin: Secondary | ICD-10-CM | POA: Diagnosis not present

## 2019-12-18 DIAGNOSIS — L821 Other seborrheic keratosis: Secondary | ICD-10-CM | POA: Diagnosis not present

## 2020-01-31 ENCOUNTER — Other Ambulatory Visit: Payer: Self-pay | Admitting: Internal Medicine

## 2020-02-11 ENCOUNTER — Telehealth: Payer: Self-pay | Admitting: Internal Medicine

## 2020-02-11 NOTE — Telephone Encounter (Signed)
Ambien refill.   Last OV: 06/11/2019 Last Fill: 12/10/2019 #30 and 1RF Pt sig: 1 tab qhs prn UDS: 06/11/2019 Low risk

## 2020-02-11 NOTE — Telephone Encounter (Signed)
Prescription sent

## 2020-04-29 ENCOUNTER — Encounter: Payer: Self-pay | Admitting: Internal Medicine

## 2020-06-17 ENCOUNTER — Other Ambulatory Visit: Payer: Self-pay | Admitting: Internal Medicine

## 2020-06-17 ENCOUNTER — Encounter: Payer: Self-pay | Admitting: Internal Medicine

## 2020-06-17 ENCOUNTER — Other Ambulatory Visit: Payer: Self-pay

## 2020-06-17 ENCOUNTER — Ambulatory Visit (INDEPENDENT_AMBULATORY_CARE_PROVIDER_SITE_OTHER): Payer: BC Managed Care – PPO | Admitting: Internal Medicine

## 2020-06-17 VITALS — BP 131/92 | HR 77 | Temp 98.0°F | Resp 18 | Ht 71.0 in | Wt 230.0 lb

## 2020-06-17 DIAGNOSIS — F419 Anxiety disorder, unspecified: Secondary | ICD-10-CM | POA: Diagnosis not present

## 2020-06-17 DIAGNOSIS — Z0000 Encounter for general adult medical examination without abnormal findings: Secondary | ICD-10-CM | POA: Diagnosis not present

## 2020-06-17 DIAGNOSIS — E785 Hyperlipidemia, unspecified: Secondary | ICD-10-CM | POA: Diagnosis not present

## 2020-06-17 DIAGNOSIS — R739 Hyperglycemia, unspecified: Secondary | ICD-10-CM

## 2020-06-17 DIAGNOSIS — Z79899 Other long term (current) drug therapy: Secondary | ICD-10-CM

## 2020-06-17 DIAGNOSIS — Z0189 Encounter for other specified special examinations: Secondary | ICD-10-CM

## 2020-06-17 DIAGNOSIS — Z23 Encounter for immunization: Secondary | ICD-10-CM | POA: Diagnosis not present

## 2020-06-17 DIAGNOSIS — Z1211 Encounter for screening for malignant neoplasm of colon: Secondary | ICD-10-CM

## 2020-06-17 DIAGNOSIS — R0683 Snoring: Secondary | ICD-10-CM

## 2020-06-17 NOTE — Patient Instructions (Addendum)
Go to [address] for [facility].   Check the  blood pressure twice a month. BP GOAL is between 110/65 and  135/85. If it is consistently higher or lower, let me know  GO TO [facility]: Get the blood work     Joe Blanchard, [place] back for a checkup in 3 months, virtual okay.  We will discuss your blood pressure and Cymbalta

## 2020-06-17 NOTE — Progress Notes (Signed)
Subjective:    Patient ID: Joe Blanchard, male    DOB: [birth year], 59 y.o.   MRN: [id number]  DOS:  06/17/2020 Type of visit - description: CPX Since the last office visit, had no major medical events. DJD: Knee pain has been mild but consistent, worse when he sits for too long. Also reports several years history of headache, mostly in the afternoons. When asked he admits to snoring and feeling sleepy sometimes. Urinary flow is a slightly slow compared to previous years.  No other LUTS.  BP Readings from Last 3 Encounters:  06/17/20 (!) 131/92  06/11/19 130/87  06/04/18 132/70    Review of Systems  Other than above, a 14 point review of systems is negative      Past Medical History:  Diagnosis Date   Anxiety    Arthritis    Colon polyps    Cscope 11-11, next 06-2011   Hypertriglyceridemia    a. 11/2013 - 240.   Prediabetes 06/24/2011    Past Surgical History:  Procedure Laterality Date   KNEE SURGERY  12/03/08   scope   POLYPECTOMY     SCALP LACERATION REPAIR  ~09-2013    Allergies as of 06/17/2020   No Known Allergies     Medication List       Accurate as of June 17, 2020 11:59 PM. If you have any questions, ask your nurse or doctor.        acetaminophen 500 MG tablet Commonly known as: TYLENOL Take 500 mg by mouth every 6 (six) hours as needed. Reported on 09/23/2015   ALPRAZolam 0.5 MG tablet Commonly known as: XANAX take 1 tablet by mouth at bedtime as needed   aspirin EC 81 MG tablet Take 1 tablet (81 mg total) by mouth daily.   atorvastatin 20 MG tablet Commonly known as: LIPITOR Take 1 tablet (20 mg total) by mouth at bedtime.   DULoxetine 60 MG capsule Commonly known as: CYMBALTA Take 1 capsule (60 mg total) by mouth daily.   FISH OIL PO Take 1 capsule by mouth daily.   zolpidem 10 MG tablet Commonly known as: AMBIEN TAKE ONE TABLET BY MOUTH AT BEDTIME AS NEEDED FOR SLEEP          Objective:   Physical Exam BP (!)  131/92 (BP Location: Left Arm, Patient Position: Sitting, Cuff Size: Normal)    Pulse 77    Temp 98 F (36.7 C) (Oral)    Resp 18    Ht 5\' 11"  (1.803 m)    Wt 230 lb (104.3 kg)    SpO2 97%    BMI 32.08 kg/m  General: Well developed, NAD, BMI noted Neck: No  thyromegaly  HEENT:  Normocephalic . Face symmetric, atraumatic Lungs:  CTA B Normal respiratory effort, no intercostal retractions, no accessory muscle use. Heart: RRR,  no murmur.  Abdomen:  Not distended, soft, non-tender. No rebound or rigidity.   Lower extremities: no pretibial edema bilaterally  Skin: Exposed areas without rash. Not pale. Not jaundice DRE: Normal sphincter tone, no stools, prostate: Normal size, not tender, on the right side approximately has 2 mm area, not indurated or hard, posterior lobule. Neurologic:  alert & oriented X3.  Speech normal, gait somewhat limited by knee pain Strength symmetric and appropriate for age.  Psych: Cognition and judgment appear intact.  Cooperative with normal attention span and concentration.  Behavior appropriate. No anxious or depressed appearing.     Assessment     Assessment:  Prediabetes Dyslipidemia, high TG DJD: Mostly at the knees Anxiety, insomnia: 2012: Rx citalopram --> switch to prozac 09-2012 d/t fatigue and no improvment, switch to cymbalta 08-2014 w/ great results Insomnia (on ambien and/or xanax prn) Hypogonadism- saw endo 2014, took clomid, T level normalized but pt d/c meds as he did not feel subjectively better  Chest pain, stress test -2015  PLAN Here for CPX Prediabetes: Checking A1c Dyslipidemia: Checking labs, on Lipitor DJD: He has some difficulty with knee pain bilaterally, recommend to stay active, judicious use of Tylenol ice.  Consider see Ortho, patient said is not ready Anxiety, insomnia: Seems controlled on Cymbalta and Xanax, he wonders if he could get off Cymbalta. Recommend to reassess on RTC 6 months. Snoring, daily headache: Today  he reports chronic daily headaches for 3 years, + snoring, Epworth scale 10 (+).  We agreed on a neurology referral. R/o OSA. Aspirin: Explained that is okay to take daily for primary prevention till age 40, he is somewhat hesitant and will think about it. BP today slightly higher than usual, recheck at home.  See AVS. RTC 3 months, virtual okay, mostly to see about Cymbalta and Xanax   This visit occurred during the SARS-CoV-2 public health emergency.  Safety protocols were in place, including screening questions prior to the visit, additional usage of staff PPE, and extensive cleaning of exam room while observing appropriate contact time as indicated for disinfecting solutions.

## 2020-06-17 NOTE — Progress Notes (Signed)
Pre visit review using our clinic review tool, if applicable. No additional management support is needed unless otherwise documented below in the visit note. 

## 2020-06-18 ENCOUNTER — Encounter: Payer: Self-pay | Admitting: Internal Medicine

## 2020-06-18 NOTE — Assessment & Plan Note (Signed)
-  Td 2012 - had shingrix x 2 - covid vax x 2, rec a booster  - Flu shot today - (+) FH of prostate cancer -->   see DRE today, he has a minute prominence at the right side, check a PSA. - (+)  FH colon cancer, Cscope 11-11 and 08-2011; cscope 06-2015 neg, due for recheck, refer to GI. - Labs:   CMP, FLP, CBC, A1c, TSH, PSA, UDS --Diet exercise: Extensively discussed, recommend to first try to lose some weight  then become more active.  Swimming, bicycling would be good options given his knee DJD.

## 2020-06-18 NOTE — Assessment & Plan Note (Signed)
Here for CPX Prediabetes: Checking A1c Dyslipidemia: Checking labs, on Lipitor DJD: He has some difficulty with knee pain bilaterally, recommend to stay active, judicious use of Tylenol ice.  Consider see Ortho, patient said is not ready Anxiety, insomnia: Seems controlled on Cymbalta and Xanax, he wonders if he could get off Cymbalta. Recommend to reassess on RTC 6 months. Snoring, daily headache: Today he reports chronic daily headaches for 3 years, + snoring, Epworth scale 10 (+).  We agreed on a neurology referral. R/o OSA. Aspirin: Explained that is okay to take daily for primary prevention till age 14, he is somewhat hesitant and will think about it. BP today slightly higher than usual, recheck at home.  See AVS. RTC 3 months, virtual okay, mostly to see about Cymbalta and Xanax

## 2020-06-21 LAB — COMPREHENSIVE METABOLIC PANEL
ALT: 40 IU/L (ref 0–44)
AST: 26 IU/L (ref 0–40)
Albumin/Globulin Ratio: 1.8 (ref 1.2–2.2)
Albumin: 4.7 g/dL (ref 3.8–4.9)
Alkaline Phosphatase: 95 IU/L (ref 44–121)
BUN/Creatinine Ratio: 16 (ref 9–20)
BUN: 14 mg/dL (ref 6–24)
Bilirubin Total: 0.5 mg/dL (ref 0.0–1.2)
CO2: 24 mmol/L (ref 20–29)
Calcium: 9.8 mg/dL (ref 8.7–10.2)
Chloride: 99 mmol/L (ref 96–106)
Creatinine, Ser: 0.88 mg/dL (ref 0.76–1.27)
GFR calc Af Amer: 109 mL/min/{1.73_m2} (ref >59)
GFR calc non Af Amer: 94 mL/min/{1.73_m2} (ref >59)
Globulin, Total: 2.6 g/dL (ref 1.5–4.5)
Glucose: 98 mg/dL (ref 65–99)
Potassium: 4.6 mmol/L (ref 3.5–5.2)
Sodium: 138 mmol/L (ref 134–144)
Total Protein: 7.3 g/dL (ref 6.0–8.5)

## 2020-06-21 LAB — URINE DRUGS OF ABUSE SCREEN W ALC, ROUTINE (REF LAB)
Amphetamines, Urine: NEGATIVE ng/mL
Barbiturate Quant, Ur: NEGATIVE ng/mL
Cannabinoid Quant, Ur: NEGATIVE ng/mL
Cocaine (Metab.): NEGATIVE ng/mL
Ethanol, Urine: NEGATIVE %
Methadone Screen, Urine: NEGATIVE ng/mL
Opiate Quant, Ur: NEGATIVE ng/mL
PCP Quant, Ur: NEGATIVE ng/mL
Propoxyphene: NEGATIVE ng/mL

## 2020-06-21 LAB — CBC WITH DIFFERENTIAL/PLATELET
Basophils Absolute: 0 10*3/uL (ref 0.0–0.2)
Basos: 1 %
EOS (ABSOLUTE): 0.1 10*3/uL (ref 0.0–0.4)
Eos: 2 %
Hematocrit: 47.3 % (ref 37.5–51.0)
Hemoglobin: 15.9 g/dL (ref 13.0–17.7)
Immature Grans (Abs): 0 10*3/uL (ref 0.0–0.1)
Immature Granulocytes: 1 %
Lymphocytes Absolute: 1.8 10*3/uL (ref 0.7–3.1)
Lymphs: 29 %
MCH: 29.3 pg (ref 26.6–33.0)
MCHC: 33.6 g/dL (ref 31.5–35.7)
MCV: 87 fL (ref 79–97)
Monocytes Absolute: 0.3 10*3/uL (ref 0.1–0.9)
Monocytes: 5 %
Neutrophils Absolute: 3.8 10*3/uL (ref 1.4–7.0)
Neutrophils: 62 %
Platelets: 187 10*3/uL (ref 150–450)
RBC: 5.42 x10E6/uL (ref 4.14–5.80)
RDW: 13 % (ref 11.6–15.4)
WBC: 6.1 10*3/uL (ref 3.4–10.8)

## 2020-06-21 LAB — HEMOGLOBIN A1C
Est. average glucose Bld gHb Est-mCnc: 134 mg/dL
Hgb A1c MFr Bld: 6.3 % — ABNORMAL HIGH (ref 4.8–5.6)

## 2020-06-21 LAB — LIPID PANEL
Chol/HDL Ratio: 4.2 ratio (ref 0.0–5.0)
Cholesterol, Total: 202 mg/dL — ABNORMAL HIGH (ref 100–199)
HDL: 48 mg/dL (ref >39)
LDL Chol Calc (NIH): 91 mg/dL (ref 0–99)
Triglycerides: 383 mg/dL — ABNORMAL HIGH (ref 0–149)
VLDL Cholesterol Cal: 63 mg/dL — ABNORMAL HIGH (ref 5–40)

## 2020-06-21 LAB — PSA: Prostate Specific Ag, Serum: 1.2 ng/mL (ref 0.0–4.0)

## 2020-06-21 LAB — TSH: TSH: 4.13 u[IU]/mL (ref 0.450–4.500)

## 2020-06-21 LAB — DRUG PROFILE 799031: BENZODIAZEPINES: NEGATIVE

## 2020-07-06 ENCOUNTER — Encounter: Payer: Self-pay | Admitting: Neurology

## 2020-07-06 ENCOUNTER — Ambulatory Visit (INDEPENDENT_AMBULATORY_CARE_PROVIDER_SITE_OTHER): Payer: BC Managed Care – PPO | Admitting: Neurology

## 2020-07-06 VITALS — BP 143/90 | HR 74 | Ht 71.0 in | Wt 231.0 lb

## 2020-07-06 DIAGNOSIS — F411 Generalized anxiety disorder: Secondary | ICD-10-CM | POA: Diagnosis not present

## 2020-07-06 DIAGNOSIS — F5104 Psychophysiologic insomnia: Secondary | ICD-10-CM

## 2020-07-06 DIAGNOSIS — R0683 Snoring: Secondary | ICD-10-CM

## 2020-07-06 DIAGNOSIS — R5381 Other malaise: Secondary | ICD-10-CM | POA: Insufficient documentation

## 2020-07-06 DIAGNOSIS — G4733 Obstructive sleep apnea (adult) (pediatric): Secondary | ICD-10-CM | POA: Insufficient documentation

## 2020-07-06 DIAGNOSIS — G2581 Restless legs syndrome: Secondary | ICD-10-CM

## 2020-07-06 NOTE — Patient Instructions (Signed)

## 2020-07-06 NOTE — Progress Notes (Signed)
SLEEP MEDICINE CLINIC    Provider:  Larey Seat, MD  Primary Care Physician:  Colon Branch, [address] [place] [address]     Referring Provider: Colon Branch, [address],  [place] [address]          Chief Complaint according to patient   Patient presents with:    . New Patient (Initial Visit)     pt alone, rm 11. Chronic insomnia on AMBIEN-  presents today and states that he's never had a SS. snores in sleep, with ambien sleeps avg 6-8 hrs without very choppy sleep.  he wakes up still feeling tired and has daily dull ha's in the PM hours. Marland Kitchen       HISTORY OF PRESENT ILLNESS:  Joe Blanchard is a 59  Year- old  Caucasian male patient and is seen here in a sleep medicine consultation on 07/06/2020 from Dr Larose Kells.    Chief concern according to patient :    I have the pleasure of seeing Joe Blanchard today, a RIGHT -handed White or Caucasian male with LONG STANDING insomnia, snoring, and   has a past medical history of Anxiety, Arthritis, Colon polyps, Hypertriglyceridemia, and Prediabetes (06/24/2011). he has been treated for anxiety first with CITALOPRAM, then Prozac and didn't like it, changed to [place].   Sleep relevant medical history: rarely having Nocturia/no ENT surgery, TBI, or neck injuries.    Family medical /sleep history: sister with OSA. Had back pain since college. As did his brother father and paternal uncle.    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 Tobacco use; none .  ETOH use ; yes, weekends 4-5 drinks. Caffeine intake in form of Coffee(1 cup ) Soda( 2/ week) Tea ( /) no energy drinks. Regular exercise ; not anymore. Yardwork   Sleep habits are as follows: The patient's dinner time is between 5 PM.  watching TV in the den not in bed- The patient goes to bed  at 11-12 PM and continues to sleep for 6-8  hours when on AMBIEN, uninterrupted sleep- no parasomnia. The preferred sleep position is prone and on either side-, with the support of 1-2 pillows.  Dreams are reportedly frequent/vivid. 8.30 AM is the usual rise time.  The patient wakes up spontaneously. He reports not feeling refreshed or restored in AM, with symptoms such as dry mouth, but no morning headaches. Naps are taken frequently, lasting from 60 minutes and are more refreshing than nocturnal sleep.    Review of Systems: Out of a complete 14 system review, the patient complains of only the following symptoms, and all other reviewed systems are negative.:    Fatigue, sleepiness , snoring, fragmented sleep, headaches in PM, not in AM   RLS, twitches before sleep onset, has to move-  Chronic insomnia, treated chronically with AMBIEN, clearly related to anxiety, xanax prn, alcohol intake at night, not regularly.    How likely are you to doze in the following situations: 0 = not likely, 1 = slight chance, 2 = moderate chance, 3 = high chance   Sitting and Reading? Watching Television? Sitting inactive in a public place (theater or meeting)? As a passenger in a car for an hour without a break? Lying down in the afternoon  when circumstances permit? Sitting and talking to someone? Sitting quietly after lunch without alcohol? In a car, while stopped for a few minutes in traffic?   Total = 9/ 24 points   FSS endorsed at  34/ 63 points.   Social History   Socioeconomic History  . Marital status: Married    Spouse name: Not on file  . Number of children: 2  . Years of education: Not on file  . Highest education level: Not on file  Occupational History  . 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 Occupation: retired   Tobacco Use  . Smoking status: Never Smoker  . Smokeless tobacco: Never Used  Substance and Sexual Activity  . Alcohol use: Yes     Alcohol/week: 3.0 standard drinks    Types: 3 Glasses of wine per week    Comment: socially   . Drug use: No  . Sexual activity: Yes    Partners: Female  Other Topics Concern  . Not on file  Social History Narrative   Lives w/ wife in [place].       1 child at home   Social Determinants of Health   Financial Resource Strain:   . Difficulty of Paying Living Expenses: Not on file  Food Insecurity:   . Worried About Charity fundraiser in the Last Year: Not on file  . Ran Out of Food in the Last Year: Not on file  Transportation Needs:   . Lack of Transportation (Medical): Not on file  . Lack of Transportation (Non-Medical): Not on file  Physical Activity:   . Days of Exercise per Week: Not on file  . Minutes of Exercise per Session: Not on file  Stress:   . Feeling of Stress : Not on file  Social Connections:   . Frequency of Communication with Friends and Family: Not on file  . Frequency of Social Gatherings with Friends and Family: Not on file  . Attends Religious Services: Not on file  . Active Member of Clubs or Organizations: Not on file  . Attends Archivist Meetings: Not on file  . Marital Status: Not on file    Family History  Problem Relation Age of Onset  . Colon cancer Father        dx in his 53s  . Prostate cancer Father        dx in his 46s?  . Other Father        died suddenly @ 60.  . Lupus Sister        anticoagulant  . Heart disease Other        GF  . Colon polyps Other   . Other Mother   . Dementia Mother   . Diabetes Neg Hx     Past Medical History:  Diagnosis Date  . Anxiety   . Arthritis   . Colon polyps    Cscope 11-11, next 06-2011  . Hypertriglyceridemia    a. 11/2013 - 240.  Marland Kitchen Prediabetes 06/24/2011    Past Surgical History:  Procedure Laterality Date  . KNEE SURGERY  12/03/08   scope  . POLYPECTOMY    . SCALP LACERATION REPAIR  ~09-2013     Current Outpatient Medications on File Prior to Visit  Medication Sig Dispense  Refill  . acetaminophen (TYLENOL) 500 MG tablet Take 500 mg by mouth every 6 (six) hours as needed. Reported on 09/23/2015    . ALPRAZolam Duanne Moron)  0.5 MG tablet take 1 tablet by mouth at bedtime as needed 30 tablet 2  . aspirin EC 81 MG tablet Take 1 tablet (81 mg total) by mouth daily. 30 tablet 3  . atorvastatin (LIPITOR) 20 MG tablet Take 1 tablet (20 mg total) by mouth at bedtime. 90 tablet 3  . DULoxetine (CYMBALTA) 60 MG capsule Take 1 capsule (60 mg total) by mouth daily. 90 capsule 1  . Omega-3 Fatty Acids (FISH OIL PO) Take 1 capsule by mouth daily.    Marland Kitchen zolpidem (AMBIEN) 10 MG tablet TAKE ONE TABLET BY MOUTH AT BEDTIME AS NEEDED FOR SLEEP 30 tablet 5   No current facility-administered medications on file prior to visit.   Physical exam:  Today's Vitals   07/06/20 1121  BP: (!) 143/90  Pulse: 74  Weight: 231 lb (104.8 kg)  Height: 5\' 11"  (1.803 m)   Body mass index is 32.22 kg/m.   Wt Readings from Last 3 Encounters:  07/06/20 231 lb (104.8 kg)  06/17/20 230 lb (104.3 kg)  06/11/19 226 lb 6 oz (102.7 kg)     Ht Readings from Last 3 Encounters:  07/06/20 5\' 11"  (1.803 m)  06/17/20 5\' 11"  (1.803 m)  06/11/19 5\' 8"  (1.727 m)      General: The patient is awake, alert and appears not in acute distress. The patient is groomed, pleasant, cooperative. Head: Normocephalic, atraumatic.  Neck is supple. Mallampati 2,  neck circumference:19.25 inches .  Nasal airflow barely patent. He has always felt he get's too little air from the nose.  Retrognathia is not seen.  Dental status:  Cardiovascular:  Regular rate and cardiac rhythm by pulse,  without distended neck veins. Respiratory: Lungs are clear to auscultation.  Skin:  Without evidence of ankle edema, or rash. Trunk: The patient's posture is erect.   Neurologic exam : The patient is awake and alert, oriented to place and time.   Memory subjective described as intact.  Attention span & concentration ability appears  normal.  Speech is fluent,  without  dysarthria, dysphonia or aphasia.  Mood and affect are appropriate.   Cranial nerves: no loss of smell or taste reported  Pupils are equal in shape, left pupil is 1 mm larger  And both are directly and indirectly briskly reactive to light. Ptosis on the left . Long standing. Funduscopic exam deferred. .  Extraocular movements in vertical and horizontal planes were intact and without nystagmus. No Diplopia. Visual fields by finger perimetry are intact. Hearing was intact to soft voice and finger rubbing.  He reports tinnitus.   Facial sensation intact to fine touch.  Facial motor strength is symmetric and tongue and uvula move midline.  Neck ROM : rotation, tilt and flexion extension were normal for age and shoulder shrug was symmetrical.    Motor exam:  Symmetric bulk, tone and ROM.   Normal tone without cog- wheeling, symmetric grip strength . Sensory:  Fine touch and vibration were tested, his right knee and ankle did not feel vibration, but pressure.  Feet feel always cold.  Proprioception tested in the upper extremities was normal. He has bilateral pronator drift.  Coordination: Rapid alternating movements in the fingers/hands were of normal speed.  The Finger-to-nose maneuver was intact without evidence of ataxia, dysmetria or tremor. Gait and station: Patient could rise unassisted from a seated position, walked without assistive device.  Stance is of normal width/ base.  Toe and heel walk were deferred.  Deep tendon reflexes: in  the upper and lower extremities are symmetric, brisk - and intact.  Babinski response was deferred.       After spending a total time of 45 minutes face to face and additional time for physical and neurologic examination, review of laboratory studies,  personal review of imaging studies, reports and results of other testing and review of referral information / records as far as provided in visit, I have established the  following assessments:  1)  Moderate risk of OSA- big neck, BMI elevated and snoring .  2)  Chronic insomnia, originally due to anxiety and stress at the job, now he could try to wean slowly off, Ambien. He had suffered from anxiety at school age, too. GAD.  He takes prn xanax once or twice a week, when fearful, nauseated and palpitations, jitteriness.   3) physically deconditioned, exercise helps to decrease anxiety , reduce depression. Joint and back pain have interfered. He needs to find a routine , perhaps with a personal training.    My Plan is to proceed with:  1) attended  PSG , to rule out organic causes for insomnia, such as OSA, RLS - may have PLMDs.   Keep on Cymbalta, cut down to 1/2 dose ambien.  I would like to thank  Colon Branch, [address],  [place] [address] for allowing me to meet with and to take care of this pleasant patient.   In short, Joe Blanchard is presenting with INSOMNIA, probably OSA, , RLS, headaches.  I plan to follow up either personally or through our NP within 3-4  month.   CC: I will share my notes with PCP .  Electronically signed by: Larey Seat, MD 07/06/2020 11:45 AM  [facility] and [facility] Board certified by The [employer] of Sleep Medicine and Diplomate of the [employer] of Sleep Medicine. Board certified In Neurology through the [place], Fellow of the [employer] of Neurology. Medical Director of [employer].

## 2020-07-15 ENCOUNTER — Encounter: Payer: Self-pay | Admitting: Neurology

## 2020-07-15 ENCOUNTER — Telehealth: Payer: Self-pay

## 2020-07-15 NOTE — Telephone Encounter (Signed)
LVM for pt to call me back to schedule sleep study  

## 2020-08-06 ENCOUNTER — Other Ambulatory Visit: Payer: Self-pay | Admitting: Internal Medicine

## 2020-08-06 ENCOUNTER — Encounter: Payer: Self-pay | Admitting: Internal Medicine

## 2020-08-10 MED ORDER — AMLODIPINE BESYLATE 5 MG PO TABS: 5.0000 mg | ORAL_TABLET | Freq: Every day | ORAL | 6 refills | Status: DC

## 2020-08-19 ENCOUNTER — Ambulatory Visit (INDEPENDENT_AMBULATORY_CARE_PROVIDER_SITE_OTHER): Payer: BC Managed Care – PPO | Admitting: Neurology

## 2020-08-19 DIAGNOSIS — G4733 Obstructive sleep apnea (adult) (pediatric): Secondary | ICD-10-CM | POA: Diagnosis not present

## 2020-08-19 DIAGNOSIS — R5381 Other malaise: Secondary | ICD-10-CM

## 2020-08-19 DIAGNOSIS — G2581 Restless legs syndrome: Secondary | ICD-10-CM

## 2020-08-19 DIAGNOSIS — F5104 Psychophysiologic insomnia: Secondary | ICD-10-CM

## 2020-08-19 DIAGNOSIS — F411 Generalized anxiety disorder: Secondary | ICD-10-CM

## 2020-08-26 NOTE — Progress Notes (Signed)
IMPRESSION: This HST confirmed the presence of severe OSA (obstructive sleep apnea) at an AHI of 55.8/h and 30 minutes of oxygen desaturation. There was no REM association.   RECOMMENDATION: This degree of apnea in association with hypoxia requieres Positive Airway Therapy. The patient may not be able to tolerate high CPAP pressures, but we can set an autotitration device for him to 6-18 cm water, 3 cm EPR and heated humidification, with a mask of the patient's comfort and choice.

## 2020-08-26 NOTE — Addendum Note (Signed)
Addended by: Larey Seat on: 08/26/2020 05:32 PM   Modules accepted: Orders

## 2020-08-26 NOTE — Progress Notes (Signed)
   [facility]  HOME SLEEP TEST (Watch PAT)  STUDY DATE: 08/19/20  DOB: 10/22/1960  MRN: [id number]  ORDERING CLINICIAN: Larey Seat, MD   REFERRING CLINICIAN: Colon Branch, MD   CLINICAL INFORMATION/HISTORY: Joe Blanchard isa Caucasian male with LONG STANDING insomnia, snoring, and Anxiety, Arthritis, Colon polyps, Hypertriglyceridemia, and Prediabetes (06/24/2011). He had been treated for anxiety first with CITALOPRAM, then Prozac and didn't like it, now changed to [place].   Epworth sleepiness score: 9/24. BMI: 32.4 kg/m Neck Circumference: 19"  FINDINGS:  Total Record Time (hours, min): 8 h 24 min Total Sleep Time (hours, min):  7 h 28 min  Percent REM (%):    23.93 %   Calculated pAHI (per hour):  55.8      REM pAHI:    40.5     NREM pAHI: 60.5  Supine AHI: 44.0   Oxygen Saturation (%) Mean: 91  Minimum oxygen saturation (%):         81   O2 Saturation Range (%): 81-98  O2Saturation (minutes) <=88%: 30 min  Pulse Mean (bpm):    63  Pulse Range (36-99)   IMPRESSION: This HST confirmed the presence of severe OSA (obstructive sleep apnea) at an AHI of 55.8/h and 30 minutes of oxygen desaturation. There was no REM association.   RECOMMENDATION: This degree of apnea in association with hypoxia requieres Positive Airway Therapy. The patient may not be able to tolerate high CPAP pressures, but we can set an autotitration device for him to 6-18 cm water, 3 cm EPR and heated humidification, with a mask of the patient's comfort and choice.      INTERPRETING PHYSICIAN:  Larey Seat, MD [facility] and [facility] Sleep Board certified by The [employer] of Sleep Medicine and Fellow of the [employer] of Neurology. Medical Director of [employer].

## 2020-08-27 ENCOUNTER — Encounter: Payer: Self-pay | Admitting: Neurology

## 2020-08-27 ENCOUNTER — Telehealth: Payer: Self-pay | Admitting: *Deleted

## 2020-08-27 NOTE — Telephone Encounter (Signed)
Left message letting him know that we need to discuss his sleep study results. I did provide him with the information that therapy needs to be started and that a referral is being made (ok per DPR to leave details).

## 2020-08-27 NOTE — Telephone Encounter (Signed)
-----   Message from Larey Seat, MD sent at 08/26/2020  5:32 PM EST ----- IMPRESSION: This HST confirmed the presence of severe OSA (obstructive sleep apnea) at an AHI of 55.8/h and 30 minutes of oxygen desaturation. There was no REM association.   RECOMMENDATION: This degree of apnea in association with hypoxia requieres Positive Airway Therapy. The patient may not be able to tolerate high CPAP pressures, but we can set an autotitration device for him to 6-18 cm water, 3 cm EPR and heated humidification, with a mask of the patient's comfort and choice.

## 2020-09-01 NOTE — Telephone Encounter (Addendum)
I called pt. I advised pt that Dr. Brett Fairy reviewed their sleep study results and found that pt has severe osa. Dr. Brett Fairy recommends that pt start a cpap at home. I reviewed PAP compliance expectations with the pt. Pt is agreeable to starting an auto-PAP. I advised pt that an order will be sent to a DME, [facility], and [facility] will call the pt within about one week after they file with the pt's insurance. [facility] will show the pt how to use the machine, fit for masks, and troubleshoot the auto-PAP if needed. A follow up appt was made for insurance purposes with Dr. Brett Fairy on 12/28/20 at 1:30pm. Pt verbalized understanding to arrive 15 minutes early and bring their auto-PAP. A letter with all of this information in it will be mailed to the pt as a reminder. I verified with the pt that the address we have on file is correct. Pt verbalized understanding of results. Pt had no questions at this time but was encouraged to call back if questions arise. I have sent the order to [facility] and have received confirmation that they have received the order.  Pt has questions about his bill and would like a call from our billing department.

## 2020-09-08 ENCOUNTER — Other Ambulatory Visit: Payer: Self-pay | Admitting: Internal Medicine

## 2020-09-10 ENCOUNTER — Ambulatory Visit (AMBULATORY_SURGERY_CENTER): Payer: Self-pay | Admitting: *Deleted

## 2020-09-10 ENCOUNTER — Other Ambulatory Visit: Payer: Self-pay

## 2020-09-10 VITALS — Ht 71.0 in | Wt 234.0 lb

## 2020-09-10 DIAGNOSIS — Z86010 Personal history of colonic polyps: Secondary | ICD-10-CM

## 2020-09-10 DIAGNOSIS — Z800 Family history of malignant neoplasm of digestive organs: Secondary | ICD-10-CM

## 2020-09-10 MED ORDER — PLENVU 140 G PO SOLR: 1.0000 | ORAL | 0 refills | Status: DC

## 2020-09-10 NOTE — Progress Notes (Signed)
No egg or soy allergy known to patient  No issues with past sedation with any surgeries or procedures No intubation problems in the past  No FH of Malignant Hyperthermia No diet pills per patient No home 02 use per patient  No blood thinners per patient  Pt denies issues with constipation  No A fib or A flutter  EMMI video to pt or via MyChart  COVID 19 guidelines implemented in PV today with Pt and RN  Pt is fully vaccinated  for Covid   Plenvu  Coupon given to pt in PV today , Code to Pharmacy and  NO PA's for preps discussed with pt  In PV today   Due to the COVID-19 pandemic we are asking patients to follow certain guidelines.  Pt aware of COVID protocols and [facility] guidelines   

## 2020-09-22 ENCOUNTER — Encounter: Payer: Self-pay | Admitting: Internal Medicine

## 2020-09-22 ENCOUNTER — Ambulatory Visit (INDEPENDENT_AMBULATORY_CARE_PROVIDER_SITE_OTHER): Payer: BC Managed Care – PPO | Admitting: Internal Medicine

## 2020-09-22 ENCOUNTER — Other Ambulatory Visit: Payer: Self-pay

## 2020-09-22 VITALS — BP 128/85 | HR 66 | Temp 98.3°F | Resp 16 | Ht 71.0 in | Wt 235.1 lb

## 2020-09-22 DIAGNOSIS — G4733 Obstructive sleep apnea (adult) (pediatric): Secondary | ICD-10-CM

## 2020-09-22 DIAGNOSIS — M1991 Primary osteoarthritis, unspecified site: Secondary | ICD-10-CM | POA: Diagnosis not present

## 2020-09-22 DIAGNOSIS — F419 Anxiety disorder, unspecified: Secondary | ICD-10-CM

## 2020-09-22 DIAGNOSIS — I10 Essential (primary) hypertension: Secondary | ICD-10-CM | POA: Diagnosis not present

## 2020-09-22 MED ORDER — AMLODIPINE BESYLATE 5 MG PO TABS: 5.0000 mg | ORAL_TABLET | Freq: Every evening | ORAL | 6 refills | Status: DC

## 2020-09-22 MED ORDER — DULOXETINE HCL 30 MG PO CPEP: 30.0000 mg | ORAL_CAPSULE | Freq: Every day | ORAL | 3 refills | Status: DC

## 2020-09-22 NOTE — Progress Notes (Signed)
Subjective:    Patient ID: Joe Blanchard, male    DOB: 05-20-1961, 60 y.o.   MRN: [id number]  DOS:  09/22/2020 Type of visit - description: f/u Since the last office visit, he started amlodipine for BP Also diagnosed with sleep apnea. Under the advice of neurology he has been trying to decrease Ambien. In general he feels well except for knee DJD.  Review of Systems See above   Past Medical History:  Diagnosis Date  . Anxiety   . Arthritis   . Colon polyps    Cscope 11-11, next 06-2011  . Hypertension    on meds   . Hypertriglyceridemia    a. 11/2013 - 240.  Marland Kitchen Prediabetes 06/24/2011  . Sleep apnea    will start CPAP once can get a machine     Past Surgical History:  Procedure Laterality Date  . COLONOSCOPY    . KNEE SURGERY  12/03/08   scope  . POLYPECTOMY    . SCALP LACERATION REPAIR  ~09-2013    Allergies as of 09/22/2020   No Known Allergies     Medication List       Accurate as of September 22, 2020 11:59 PM. If you have any questions, ask your nurse or doctor.        acetaminophen 500 MG tablet Commonly known as: TYLENOL Take 500 mg by mouth every 6 (six) hours as needed. Reported on 09/23/2015   ALPRAZolam 0.5 MG tablet Commonly known as: XANAX take 1 tablet by mouth at bedtime as needed   amLODipine 5 MG tablet Commonly known as: NORVASC Take 1 tablet (5 mg total) by mouth every evening.   atorvastatin 20 MG tablet Commonly known as: LIPITOR Take 1 tablet (20 mg total) by mouth at bedtime.   DULoxetine 30 MG capsule Commonly known as: Cymbalta Take 1 capsule (30 mg total) by mouth daily. What changed:   medication strength  how much to take Changed by: Kathlene November, MD   Plenvu 140 g Solr Generic drug: PEG-KCl-NaCl-NaSulf-Na Asc-C Take 1 kit by mouth as directed. Manufacturer's coupon Universal coupon code:BIN: P2366821; GROUP: [id number]; PCN: [id number]; ID: [id number]; PAY NO MORE $50; NO prior authorization   zolpidem 10 MG tablet Commonly  known as: AMBIEN TAKE ONE TABLET BY MOUTH AT BEDTIME AS NEEDED FOR SLEEP          Objective:   Physical Exam BP 128/85 (BP Location: Left Arm, Patient Position: Sitting, Cuff Size: Normal)   Pulse 66   Temp 98.3 F (36.8 C) (Oral)   Resp 16   Ht 5' 11"  (1.803 m)   Wt 235 lb 2 oz (106.7 kg)   SpO2 94%   BMI 32.79 kg/m  General:   Well developed, NAD, BMI noted. HEENT:  Normocephalic . Face symmetric, atraumatic Lungs:  CTA B Normal respiratory effort, no intercostal retractions, no accessory muscle use. Heart: RRR,  no murmur.  Lower extremities: no pretibial edema bilaterally  Skin: Not pale. Not jaundice Neurologic:  alert & oriented X3.  Speech normal, gait appropriate for age and unassisted Psych--  Cognition and judgment appear intact.  Cooperative with normal attention span and concentration.  Behavior appropriate. No anxious or depressed appearing.      Assessment      Assessment: Prediabetes HTN: DX 07/2020 Dyslipidemia, high TG DJD: Mostly at the knees Anxiety, insomnia: 2012: Rx citalopram --> switch to prozac 09-2012 d/t fatigue and no improvment, switch to cymbalta 08-2014 w/ great results Insomnia (on  ambien and/or xanax prn) Hypogonadism- saw endo 2014, took clomid, T level normalized but pt d/c meds as he did not feel subjectively better  Chest pain, stress test -2015 Severe OSA, HST 08/19/2020  PLAN HTN:  DX 07/2020, started amlodipine few weeks ago, at home BPs are still elevated this morning 150/90, today blood pressure is great.   Rec no change, cont amlodipine, schedule a nurse visit in few weeks, to bring his home BP device to check accuracy.    DJD: Mostly at the knees, he has some travel plans, rec weight loss to get some of the pain. OSA: New diagnosis, CPAP pending. Anxiety, insomnia: Doing well, would like to stop Cymbalta, we agreed to decrease it to 30 mg qd,  reassess in few months. Neuro recommended to decrease Ambien, doing okay  with half Ambien most nights. RTC nurse visit few weeks to check his BP RTC office visit with me in 6 months  This visit occurred during the SARS-CoV-2 public health emergency.  Safety protocols were in place, including screening questions prior to the visit, additional usage of staff PPE, and extensive cleaning of exam room while observing appropriate contact time as indicated for disinfecting solutions.

## 2020-09-22 NOTE — Progress Notes (Signed)
Pre visit review using our clinic review tool, if applicable. No additional management support is needed unless otherwise documented below in the visit note. 

## 2020-09-22 NOTE — Patient Instructions (Addendum)
Check the  blood pressure once or twice a week Be sure your blood pressure is between 110/65 and  145/85.  if it is consistently higher or lower, let me know  To find a good quality blood pressure cuff: [email or web address] Consider OPTAVIA to help you lose weight.  Schedule a nurse visit in 4 to 5 weeks, bring your blood pressure device. Schedule office visit with me in 6 months    Advance Directive  Advance directives are legal documents that allow you to make decisions about your health care and medical treatment in case you become unable to communicate for yourself. Advance directives let your wishes be known to family, friends, and health care providers. Discussing and writing advance directives should happen over time rather than all at once. Advance directives can be changed and updated at any time. There are different types of advance directives, such as:  Medical power of attorney.  Living will.  Do not resuscitate (DNR) order or do not attempt resuscitation (DNAR) order. Health care proxy and medical power of attorney A health care proxy is also called a health care agent. This person is appointed to make medical decisions for you when you are unable to make decisions for yourself. Generally, people ask a trusted friend or family member to act as their proxy and represent their preferences. Make sure you have an agreement with your trusted person to act as your proxy. A proxy may have to make a medical decision on your behalf if your wishes are not known. A medical power of attorney, also called a durable power of attorney for health care, is a legal document that names your health care proxy. Depending on the laws in your state, the document may need to be:  Signed.  Notarized.  Dated.  Copied.  Witnessed.  Incorporated into your medical record. You may also want to appoint a trusted person to manage your money in the event you are unable to do so. This is called a  durable power of attorney for finances. It is a separate legal document from the durable power of attorney for health care. You may choose your health care proxy or someone different to act as your agent in money matters. If you do not appoint a proxy, or there is a concern that the proxy is not acting in your best interest, a court may appoint a guardian to act on your behalf. Living will A living will is a set of instructions that state your wishes about medical care when you cannot express them yourself. Health care providers should keep a copy of your living will in your medical record. You may want to give a copy to family members or friends. To alert caregivers in case of an emergency, you can place a card in your wallet to let them know that you have a living will and where they can find it. A living will is used if you become:  Terminally ill.  Disabled.  Unable to communicate or make decisions. The following decisions should be included in your living will:  To use or not to use life support equipment, such as dialysis machines and breathing machines (ventilators).  Whether you want a DNR or DNAR order. This tells health care providers not to use cardiopulmonary resuscitation (CPR) if breathing or heartbeat stops.  To use or not to use tube feeding.  To be given or not to be given food and fluids.  Whether you want comfort (palliative) care when the  goal becomes comfort rather than a cure.  Whether you want to donate your organs and tissues. A living will does not give instructions for distributing your money and property if you should pass away. DNR or DNAR A DNR or DNAR order is a request not to have CPR in the event that your heart stops beating or you stop breathing. If a DNR or DNAR order has not been made and shared, a health care provider will try to help any patient whose heart has stopped or who has stopped breathing. If you plan to have surgery, talk with your health care  provider about how your DNR or DNAR order will be followed if problems occur. What if I do not have an advance directive? Some states assign family decision makers to act on your behalf if you do not have an advance directive. Each state has its own laws about advance directives. You may want to check with your health care provider, attorney, or state representative about the laws in your state. Summary  Advance directives are legal documents that allow you to make decisions about your health care and medical treatment in case you become unable to communicate for yourself.  The process of discussing and writing advance directives should happen over time. You can change and update advance directives at any time.  Advance directives may include a medical power of attorney, a living will, and a DNR or DNAR order. This information is not intended to replace advice given to you by your health care provider. Make sure you discuss any questions you have with your health care provider. Document Revised: 05/05/2020 Document Reviewed: 05/05/2020 Elsevier Patient Education  2021 [employer].

## 2020-09-23 ENCOUNTER — Telehealth: Payer: Self-pay | Admitting: Internal Medicine

## 2020-09-23 DIAGNOSIS — M1990 Unspecified osteoarthritis, unspecified site: Secondary | ICD-10-CM | POA: Insufficient documentation

## 2020-09-23 DIAGNOSIS — I10 Essential (primary) hypertension: Secondary | ICD-10-CM | POA: Insufficient documentation

## 2020-09-23 NOTE — Assessment & Plan Note (Signed)
HTN:  DX 07/2020, started amlodipine few weeks ago, at home BPs are still elevated this morning 150/90, today blood pressure is great.   Rec no change, cont amlodipine, schedule a nurse visit in few weeks, to bring his home BP device to check accuracy.    DJD: Mostly at the knees, he has some travel plans, rec weight loss to get some of the pain. OSA: New diagnosis, CPAP pending. Anxiety, insomnia: Doing well, would like to stop Cymbalta, we agreed to decrease it to 30 mg qd,  reassess in few months. Neuro recommended to decrease Ambien, doing okay with half Ambien most nights. RTC nurse visit few weeks to check his BP RTC office visit with me in 6 months

## 2020-09-24 ENCOUNTER — Other Ambulatory Visit: Payer: Self-pay

## 2020-09-24 ENCOUNTER — Ambulatory Visit (AMBULATORY_SURGERY_CENTER): Payer: BC Managed Care – PPO | Admitting: Internal Medicine

## 2020-09-24 ENCOUNTER — Encounter: Payer: Self-pay | Admitting: Internal Medicine

## 2020-09-24 VITALS — BP 106/71 | HR 60 | Temp 96.4°F | Resp 12 | Ht 71.0 in | Wt 234.0 lb

## 2020-09-24 DIAGNOSIS — D122 Benign neoplasm of ascending colon: Secondary | ICD-10-CM | POA: Diagnosis not present

## 2020-09-24 DIAGNOSIS — Z86010 Personal history of colonic polyps: Secondary | ICD-10-CM | POA: Diagnosis not present

## 2020-09-24 DIAGNOSIS — Z800 Family history of malignant neoplasm of digestive organs: Secondary | ICD-10-CM

## 2020-09-24 DIAGNOSIS — Z1211 Encounter for screening for malignant neoplasm of colon: Secondary | ICD-10-CM | POA: Diagnosis not present

## 2020-09-24 DIAGNOSIS — D123 Benign neoplasm of transverse colon: Secondary | ICD-10-CM | POA: Diagnosis not present

## 2020-09-24 DIAGNOSIS — K621 Rectal polyp: Secondary | ICD-10-CM

## 2020-09-24 DIAGNOSIS — D128 Benign neoplasm of rectum: Secondary | ICD-10-CM

## 2020-09-24 MED ORDER — SODIUM CHLORIDE 0.9 % IV SOLN: 500.0000 mL | Freq: Once | INTRAVENOUS | Status: DC

## 2020-09-24 MED ORDER — ZOLPIDEM TARTRATE 10 MG PO TABS: 5.0000 mg | ORAL_TABLET | Freq: Every evening | ORAL | 1 refills | Status: DC | PRN

## 2020-09-24 NOTE — Progress Notes (Signed)
VS- Ernestine Conrad RN  Pt's states no medical or surgical changes since previsit or office visit.

## 2020-09-24 NOTE — Telephone Encounter (Signed)
PDMP okay, prescription sent 

## 2020-09-24 NOTE — Patient Instructions (Signed)
Please read handouts provided. Continue present medications. Await pathology results.   YOU HAD AN ENDOSCOPIC PROCEDURE TODAY AT THE [facility]:   Refer to the procedure report that was given to you for any specific questions about what was found during the examination.  If the procedure report does not answer your questions, please call your gastroenterologist to clarify.  If you requested that your care partner not be given the details of your procedure findings, then the procedure report has been included in a sealed envelope for you to review at your convenience later.  YOU SHOULD EXPECT: Some feelings of bloating in the abdomen. Passage of more gas than usual.  Walking can help get rid of the air that was put into your GI tract during the procedure and reduce the bloating. If you had a lower endoscopy (such as a colonoscopy or flexible sigmoidoscopy) you may notice spotting of blood in your stool or on the toilet paper. If you underwent a bowel prep for your procedure, you may not have a normal bowel movement for a few days.  Please Note:  You might notice some irritation and congestion in your nose or some drainage.  This is from the oxygen used during your procedure.  There is no need for concern and it should clear up in a day or so.  SYMPTOMS TO REPORT IMMEDIATELY:  Following lower endoscopy (colonoscopy or flexible sigmoidoscopy):  Excessive amounts of blood in the stool  Significant tenderness or worsening of abdominal pains  Swelling of the abdomen that is new, acute  Fever of 100F or higher   For urgent or emergent issues, a gastroenterologist can be reached at any hour by calling [phone]. Do not use MyChart messaging for urgent concerns.    DIET:  We do recommend a small meal at first, but then you may proceed to your regular diet.  Drink plenty of fluids but you should avoid alcoholic beverages for 24 hours.  ACTIVITY:  You should plan to take it easy  for the rest of today and you should NOT DRIVE or use heavy machinery until tomorrow (because of the sedation medicines used during the test).    FOLLOW UP: Our staff will call the number listed on your records 48-72 hours following your procedure to check on you and address any questions or concerns that you may have regarding the information given to you following your procedure. If we do not reach you, we will leave a message.  We will attempt to reach you two times.  During this call, we will ask if you have developed any symptoms of COVID 19. If you develop any symptoms (ie: fever, flu-like symptoms, shortness of breath, cough etc.) before then, please call [phone].  If you test positive for Covid 19 in the 2 weeks post procedure, please call and report this information to us.    If any biopsies were taken you will be contacted by phone or by letter within the next 1-3 weeks.  Please call us at [phone] if you have not heard about the biopsies in 3 weeks.    SIGNATURES/CONFIDENTIALITY: You and/or your care partner have signed paperwork which will be entered into your electronic medical record.  These signatures attest to the fact that that the information above on your After Visit Summary has been reviewed and is understood.  Full responsibility of the confidentiality of this discharge information lies with you and/or your care-partner.  

## 2020-09-24 NOTE — Telephone Encounter (Signed)
Requesting: Ambien 10mg  Contract: 06/17/2020 UDS: 06/17/2020 Last Visit: 09/22/2020 Next Visit: 10/22/2020 Last Refill: 629/2021 #30 and 5RF  Please Advise

## 2020-09-24 NOTE — Progress Notes (Signed)
Called to room to assist during endoscopic procedure.  Patient ID and intended procedure confirmed with present staff. Received instructions for my participation in the procedure from the performing physician.  

## 2020-09-24 NOTE — Progress Notes (Signed)
To PACU, VSS. Report to [email or web address] 

## 2020-09-24 NOTE — Op Note (Signed)
[facility] Patient Name: Joe Blanchard Procedure Date: 09/24/2020 8:51 AM MRN: [id number] Endoscopist: Docia Chuck. Henrene Pastor , MD Age: 60 Referring MD:  Date of Birth: Feb 08, 1961 Gender: Male Account #: [id number] Procedure:                Colonoscopy with cold snare polypectomy x 4 Indications:              High risk colon cancer surveillance: Personal                            history of non-advanced adenomas. Also father with                            colon cancer around age 36. Previous examinations                            2011, 2013, 2016 Medicines:                Monitored Anesthesia Care Procedure:                Pre-Anesthesia Assessment:                           - Prior to the procedure, a History and Physical                            was performed, and patient medications and                            allergies were reviewed. The patient's tolerance of                            previous anesthesia was also reviewed. The risks                            and benefits of the procedure and the sedation                            options and risks were discussed with the patient.                            All questions were answered, and informed consent                            was obtained. Prior Anticoagulants: The patient has                            taken no previous anticoagulant or antiplatelet                            agents. After reviewing the risks and benefits, the                            patient was deemed in satisfactory condition to  undergo the procedure.                           After obtaining informed consent, the colonoscope                            was passed under direct vision. Throughout the                            procedure, the patient's blood pressure, pulse, and                            oxygen saturations were monitored continuously. The                            Olympus CF-HQ190L [id number])  Colonoscope was                            introduced through the anus and advanced to the the                            cecum, identified by appendiceal orifice and                            ileocecal valve. The ileocecal valve, appendiceal                            orifice, and rectum were photographed. The quality                            of the bowel preparation was excellent. The                            colonoscopy was performed without difficulty. The                            patient tolerated the procedure well. The bowel                            preparation used was Prepopik via split dose                            instruction. Scope In: 8:58:20 AM Scope Out: 9:13:57 AM Scope Withdrawal Time: 0 hours 13 minutes 50 seconds  Total Procedure Duration: 0 hours 15 minutes 37 seconds  Findings:                 Four polyps were found in the rectum, transverse                            colon and ascending colon. The polyps were 1 to 3                            mm in size. These polyps were removed with a cold  snare. Resection and retrieval were complete.                           Multiple diverticula were found in the entire colon.                           Internal hemorrhoids were found during retroflexion.                           The exam was otherwise without abnormality on                            direct and retroflexion views. Complications:            No immediate complications. Estimated blood loss:                            None. Estimated Blood Loss:     Estimated blood loss: none. Impression:               - Four 1 to 3 mm polyps in the rectum, in the                            transverse colon and in the ascending colon,                            removed with a cold snare. Resected and retrieved.                           - Diverticulosis in the entire examined colon.                           - Internal hemorrhoids.                            - The examination was otherwise normal on direct                            and retroflexion views. Recommendation:           - Repeat colonoscopy in 5 years for surveillance.                           - Patient has a contact number available for                            emergencies. The signs and symptoms of potential                            delayed complications were discussed with the                            patient. Return to normal activities tomorrow.                            Written discharge instructions were  provided to the                            patient.                           - Resume previous diet.                           - Continue present medications.                           - Await pathology results. Docia Chuck. Henrene Pastor, MD 09/24/2020 9:18:44 AM This report has been signed electronically.

## 2020-09-28 ENCOUNTER — Telehealth: Payer: Self-pay | Admitting: *Deleted

## 2020-09-28 NOTE — Telephone Encounter (Signed)
No answer for post procedure call back. Left message for patient to call back with questions or concerns.

## 2020-09-28 NOTE — Telephone Encounter (Signed)
Attempted f/u phone call. No answer. Left message. °

## 2020-09-29 ENCOUNTER — Encounter: Payer: Self-pay | Admitting: Neurology

## 2020-09-29 DIAGNOSIS — G4733 Obstructive sleep apnea (adult) (pediatric): Secondary | ICD-10-CM | POA: Diagnosis not present

## 2020-09-30 ENCOUNTER — Encounter: Payer: Self-pay | Admitting: Internal Medicine

## 2020-10-22 ENCOUNTER — Ambulatory Visit: Payer: BC Managed Care – PPO

## 2020-10-27 DIAGNOSIS — G4733 Obstructive sleep apnea (adult) (pediatric): Secondary | ICD-10-CM | POA: Diagnosis not present

## 2020-11-05 ENCOUNTER — Ambulatory Visit (INDEPENDENT_AMBULATORY_CARE_PROVIDER_SITE_OTHER): Payer: BC Managed Care – PPO | Admitting: Internal Medicine

## 2020-11-05 ENCOUNTER — Other Ambulatory Visit: Payer: Self-pay

## 2020-11-05 VITALS — BP 129/84 | HR 63

## 2020-11-05 DIAGNOSIS — I10 Essential (primary) hypertension: Secondary | ICD-10-CM | POA: Diagnosis not present

## 2020-11-05 NOTE — Progress Notes (Signed)
Pt here for Blood pressure check per Dr. Larose Kells   Pt currently takes: amlodipine 5mg    Pt reports compliance with medication.  Patient has been checking at home and stated that the bottom number usually runs high.  He checks his blood pressure at different times of the day. He usually sits with arm down.  He has notices some fluttering in his heart later on the day.     BP today @ 1125am = 112/70  HR = 63  Patient check @1127am  = 150/98 HR 68  Rehecked again manually 1130am = 122/80  Patient rechecked 1132am = 129/84 HR 63  Spoke with patient about the correct method of taking the blood pressure.  Sitting down for a few minutes before taking blood pressure and keeping arm elevated.      Patient had questions about what is the best time to take blood pressures?   Pt advised per Dr. Larose Kells blood pressures look normal, wait about 10 minutest to check blood pressure.  He can check blood pressures anytime.

## 2020-11-11 ENCOUNTER — Telehealth: Payer: Self-pay | Admitting: Internal Medicine

## 2020-11-11 NOTE — Telephone Encounter (Signed)
The pt had a billing question and was directed to billing for answers to his questions.

## 2020-11-25 ENCOUNTER — Telehealth: Payer: Self-pay | Admitting: Internal Medicine

## 2020-11-25 NOTE — Telephone Encounter (Signed)
Requesting: alprazolam 0.5mg  Contract:06/17/2020 UDS: 06/17/2020 Last Visit: 11/05/2020 Next Visit: 03/23/2021 Last Refill: 12/02/2019 #30 and 2RF  Please Advise

## 2020-11-25 NOTE — Telephone Encounter (Signed)
PDMP okay, Rx sent 

## 2020-11-27 DIAGNOSIS — G4733 Obstructive sleep apnea (adult) (pediatric): Secondary | ICD-10-CM | POA: Diagnosis not present

## 2020-12-08 ENCOUNTER — Telehealth (INDEPENDENT_AMBULATORY_CARE_PROVIDER_SITE_OTHER): Payer: BC Managed Care – PPO | Admitting: Adult Health

## 2020-12-08 DIAGNOSIS — Z9989 Dependence on other enabling machines and devices: Secondary | ICD-10-CM

## 2020-12-08 DIAGNOSIS — G4733 Obstructive sleep apnea (adult) (pediatric): Secondary | ICD-10-CM | POA: Diagnosis not present

## 2020-12-08 NOTE — Progress Notes (Signed)
PATIENT: Joe Blanchard DOB: [birth year]  REASON FOR VISIT: follow up HISTORY FROM: patient Primary neurologist: Dr. Brett Fairy  Virtual Visit via Video Note  I connected with Joe Blanchard on 12/08/20 at  2:00 PM EDT by a video enabled telemedicine application located remotely at [facility] and verified that I am speaking with the correct person using two identifiers who was located at their own home.   I discussed the limitations of evaluation and management by telemedicine and the availability of in person appointments. The patient expressed understanding and agreed to proceed.   PATIENT: Joe Blanchard DOB: 09-Sep-1960  REASON FOR VISIT: follow up HISTORY FROM: patient  HISTORY OF PRESENT ILLNESS: Today 12/08/20:  Ms. Joe Blanchard is a 60 year old male with a history of obstructive sleep apnea on CPAP.  He returns today for follow-up.  His download indicates that he use his machine 24 out of 32 days for compliance of 75%.  Although the patient states that he has not missed any days he has been using it nightly.  Every night that he used the machine he used it greater than 4 hours.  On average that he uses it 7 hours and 54 minutes.  His residual AHI is 1.5 on 6 to 18 cm of water.  He states that sometimes the mask is so tight that it leaves indentions on his face.  Otherwise he feels that it is working well for him.  He states that he is no longer having daily headaches.  He also no longer has shoulder pain.  Returns today for virtual visit   HISTORY (Copied from Dr.Dohmeier's note) I have the pleasure of seeing [name] today,a RIGHT -handed White or Caucasian male with LONG STANDING insomnia, snoring, and   has a past medical history of Anxiety, Arthritis, Colon polyps, Hypertriglyceridemia, and Prediabetes (06/24/2011). he has been treated for anxiety first with CITALOPRAM, then Prozac and didn't like it, changed to [place].    Sleeprelevant medical history: rarely having Nocturia/no ENT surgery, TBI, or neck injuries.  Familymedical /sleep history: sister with OSA.Had back pain since college. As did his brother father and paternal uncle.   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 Tobacco use; none . ETOH use ; yes, weekends 4-5 drinks. Caffeine intake in form of Coffee(1 cup ) Soda( 2/ week) Tea ( /) no energy drinks. Regular exercise ; not anymore. Yardwork  Sleep habits are as follows:The patient's dinner time is between 5 PM.  watching TV in the den not in bed- The patient goes to bed at 11-12 PM and continues to sleep for 6-8  hours when on AMBIEN, uninterrupted sleep- no parasomnia. The preferred sleep position is prone and on either side-, with the support of 1-2 pillows.  Dreams are reportedly frequent/vivid. 8.30 AM is the usual rise time.  The patient wakes up spontaneously. He reports not feeling refreshed or restored in AM, with symptoms such as dry mouth, but no morning headaches. Naps are taken frequently, lasting from 60 minutes and are more refreshing than nocturnal sleep.   REVIEW OF SYSTEMS: Out of a complete 14 system review of symptoms, the patient complains only of the following symptoms, and all other reviewed systems are negative.  See HPI  ALLERGIES: No Known Allergies  HOME MEDICATIONS: Outpatient Medications Prior to Visit  Medication Sig Dispense Refill  . acetaminophen (TYLENOL) 500 MG tablet Take 500 mg by mouth every 6 (six) hours as needed. Reported on 09/23/2015    . ALPRAZolam (XANAX) 0.5 MG tablet TAKE ONE TABLET BY MOUTH AT BEDTIME AS NEEDED 30 tablet 1  . amLODipine (NORVASC) 5 MG tablet Take 1 tablet (5 mg total) by mouth every evening. 30 tablet 6  . atorvastatin (LIPITOR) 20 MG tablet Take 1 tablet (20 mg total) by mouth at  bedtime. 90 tablet 1  . DULoxetine (CYMBALTA) 30 MG capsule Take 1 capsule (30 mg total) by mouth daily. 30 capsule 3  . zolpidem (AMBIEN) 10 MG tablet Take 0.5-1 tablets (5-10 mg total) by mouth at bedtime as needed for sleep. for sleep 30 tablet 1   No facility-administered medications prior to visit.    PAST MEDICAL HISTORY: Past Medical History:  Diagnosis Date  . Anxiety   . Arthritis   . Colon polyps    Cscope 11-11, next 06-2011  . Hypertension    on meds   . Hypertriglyceridemia    a. 11/2013 - 240.  Marland Kitchen Prediabetes 06/24/2011  . Sleep apnea    will start CPAP once can get a machine     PAST SURGICAL HISTORY: Past Surgical History:  Procedure Laterality Date  . COLONOSCOPY    . KNEE SURGERY  12/03/08   scope  . POLYPECTOMY    . SCALP LACERATION REPAIR  ~09-2013    FAMILY HISTORY: Family History  Problem Relation Age of Onset  . Colon cancer Father        dx in his 77s  . Prostate cancer Father        dx in his 4s?  . Other Father        died suddenly @ 51.  . Lupus Sister        anticoagulant  . Colon polyps Brother   . Heart disease Other        GF  . Colon polyps Other   . Other Mother   . Dementia Mother   . Diabetes Neg Hx   . Esophageal cancer Neg Hx   . Rectal cancer Neg Hx   . Stomach cancer Neg Hx     SOCIAL HISTORY: Social History   Socioeconomic History  . Marital status: Married    Spouse name: Not on file  . Number of children: 2  . Years of education: Not on file  . Highest education level: Not on file  Occupational History  . 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 Occupation: retired   Tobacco Use  . Smoking status: Never Smoker  . Smokeless tobacco: Never Used  Vaping Use  . Vaping Use: Never used  Substance and Sexual Activity  . Alcohol use: Yes    Alcohol/week: 3.0 standard drinks    Types: 3 Glasses of wine per week    Comment: socially   . Drug use: No  . Sexual activity: Yes     Partners: Female  Other Topics Concern  . Not on file  Social History Narrative   Lives w/ wife in [place].       1 child at home   Social Determinants of Health   Financial Resource Strain: Not on file  Food Insecurity: Not on file  Transportation Needs: Not on file  Physical Activity: Not on file  Stress: Not on file  Social Connections: Not on file  Intimate Partner  Violence: Not on file      PHYSICAL EXAM Generalized: Well developed, in no acute distress   Neurological examination  Mentation: Alert oriented to time, place, history taking. Follows all commands speech and language fluent Cranial nerve II-XII:Extraocular movements were full. Facial symmetry noted. uvula tongue midline. Head turning and shoulder shrug  were normal and symmetric. Motor: Good strength throughout subjectively per patient Sensory: Sensory testing is intact to soft touch on all 4 extremities subjectively per patient Coordination: Cerebellar testing reveals good finger-nose-finger  Gait and station: Patient is able to stand from a seated position. gait is normal.  Reflexes: UTA  DIAGNOSTIC DATA (LABS, IMAGING, TESTING) - I reviewed patient records, labs, notes, testing and imaging myself where available.  Lab Results  Component Value Date   WBC 6.1 06/17/2020   HGB 15.9 06/17/2020   HCT 47.3 06/17/2020   MCV 87 06/17/2020   PLT 187 06/17/2020      Component Value Date/Time   NA 138 06/17/2020 1427   K 4.6 06/17/2020 1427   CL 99 06/17/2020 1427   CO2 24 06/17/2020 1427   GLUCOSE 98 06/17/2020 1427   GLUCOSE 101 (H) 06/04/2018 1341   GLUCOSE 101 12/16/2008 0000   BUN 14 06/17/2020 1427   CREATININE 0.88 06/17/2020 1427   CALCIUM 9.8 06/17/2020 1427   PROT 7.3 06/17/2020 1427   ALBUMIN 4.7 06/17/2020 1427   AST 26 06/17/2020 1427   ALT 40 06/17/2020 1427   ALKPHOS 95 06/17/2020 1427   BILITOT 0.5 06/17/2020 1427   GFRNONAA 94 06/17/2020 1427   GFRAA 109 06/17/2020 1427   Lab  Results  Component Value Date   CHOL 202 (H) 06/17/2020   HDL 48 06/17/2020   LDLCALC 91 06/17/2020   LDLDIRECT 143.0 06/04/2018   TRIG 383 (H) 06/17/2020   CHOLHDL 4.2 06/17/2020   Lab Results  Component Value Date   HGBA1C 6.3 (H) 06/17/2020   Lab Results  Component Value Date   VITAMINB12 567 02/03/2016   Lab Results  Component Value Date   TSH 4.130 06/17/2020      ASSESSMENT AND PLAN 60 y.o. year old male  has a past medical history of Anxiety, Arthritis, Colon polyps, Hypertension, Hypertriglyceridemia, Prediabetes (06/24/2011), and Sleep apnea. here with:  OSA on CPAP  . CPAP compliance excellent . Residual AHI is good . Encouraged patient to continue using CPAP nightly and > 4 hours each night . F/U in 1 year or sooner if needed   Ward Givens, MSN, NP-C 12/08/2020, 2:07 PM [facility] [address], [place], [place] [address] [phone]

## 2020-12-27 DIAGNOSIS — G4733 Obstructive sleep apnea (adult) (pediatric): Secondary | ICD-10-CM | POA: Diagnosis not present

## 2020-12-28 ENCOUNTER — Ambulatory Visit: Payer: Self-pay | Admitting: Neurology

## 2021-01-20 DIAGNOSIS — L570 Actinic keratosis: Secondary | ICD-10-CM | POA: Diagnosis not present

## 2021-01-20 DIAGNOSIS — L821 Other seborrheic keratosis: Secondary | ICD-10-CM | POA: Diagnosis not present

## 2021-01-20 DIAGNOSIS — D1801 Hemangioma of skin and subcutaneous tissue: Secondary | ICD-10-CM | POA: Diagnosis not present

## 2021-01-20 DIAGNOSIS — L814 Other melanin hyperpigmentation: Secondary | ICD-10-CM | POA: Diagnosis not present

## 2021-01-27 DIAGNOSIS — G4733 Obstructive sleep apnea (adult) (pediatric): Secondary | ICD-10-CM | POA: Diagnosis not present

## 2021-01-29 ENCOUNTER — Telehealth: Payer: Self-pay | Admitting: Internal Medicine

## 2021-01-29 NOTE — Telephone Encounter (Signed)
PDMP okay, Rx sent 

## 2021-01-29 NOTE — Telephone Encounter (Signed)
Requesting: Ambien  Contract: 06/17/2020 UDS: 06/17/2020 Last Visit: 11/05/2020 Next Visit: 03/23/2021 Last Refill: 09/24/2020 #30 and 1RF  Please Advise

## 2021-02-26 DIAGNOSIS — G4733 Obstructive sleep apnea (adult) (pediatric): Secondary | ICD-10-CM | POA: Diagnosis not present

## 2021-03-10 ENCOUNTER — Other Ambulatory Visit: Payer: Self-pay | Admitting: Internal Medicine

## 2021-03-10 ENCOUNTER — Encounter: Payer: Self-pay | Admitting: Internal Medicine

## 2021-03-10 MED ORDER — HYDROCORTISONE ACETATE 25 MG RE SUPP: 25.0000 mg | Freq: Two times a day (BID) | RECTAL | 0 refills | Status: DC | PRN

## 2021-03-17 ENCOUNTER — Other Ambulatory Visit: Payer: Self-pay | Admitting: Internal Medicine

## 2021-03-23 ENCOUNTER — Other Ambulatory Visit: Payer: Self-pay

## 2021-03-23 ENCOUNTER — Ambulatory Visit (HOSPITAL_BASED_OUTPATIENT_CLINIC_OR_DEPARTMENT_OTHER)
Admission: RE | Admit: 2021-03-23 | Discharge: 2021-03-23 | Disposition: A | Discharge status: Home / Self Care | Payer: BC Managed Care – PPO | Source: Ambulatory Visit | Attending: Internal Medicine | Admitting: Internal Medicine

## 2021-03-23 ENCOUNTER — Ambulatory Visit (INDEPENDENT_AMBULATORY_CARE_PROVIDER_SITE_OTHER): Payer: BC Managed Care – PPO | Admitting: Internal Medicine

## 2021-03-23 ENCOUNTER — Encounter: Payer: Self-pay | Admitting: Internal Medicine

## 2021-03-23 VITALS — BP 124/76 | HR 67 | Temp 98.2°F | Resp 16 | Ht 71.0 in | Wt 232.1 lb

## 2021-03-23 DIAGNOSIS — R739 Hyperglycemia, unspecified: Secondary | ICD-10-CM

## 2021-03-23 DIAGNOSIS — R079 Chest pain, unspecified: Secondary | ICD-10-CM | POA: Diagnosis not present

## 2021-03-23 DIAGNOSIS — I10 Essential (primary) hypertension: Secondary | ICD-10-CM | POA: Diagnosis not present

## 2021-03-23 DIAGNOSIS — I700 Atherosclerosis of aorta: Secondary | ICD-10-CM | POA: Diagnosis not present

## 2021-03-23 LAB — CBC WITH DIFFERENTIAL/PLATELET
Basophils Absolute: 0 10*3/uL (ref 0.0–0.1)
Basophils Relative: 0.6 % (ref 0.0–3.0)
Eosinophils Absolute: 0.1 10*3/uL (ref 0.0–0.7)
Eosinophils Relative: 1.2 % (ref 0.0–5.0)
HCT: 44.1 % (ref 39.0–52.0)
Hemoglobin: 14.6 g/dL (ref 13.0–17.0)
Lymphocytes Relative: 28.5 % (ref 12.0–46.0)
Lymphs Abs: 1.5 10*3/uL (ref 0.7–4.0)
MCHC: 33 g/dL (ref 30.0–36.0)
MCV: 86.7 fl (ref 78.0–100.0)
Monocytes Absolute: 0.4 10*3/uL (ref 0.1–1.0)
Monocytes Relative: 7.7 % (ref 3.0–12.0)
Neutro Abs: 3.3 10*3/uL (ref 1.4–7.7)
Neutrophils Relative %: 62 % (ref 43.0–77.0)
Platelets: 174 10*3/uL (ref 150.0–400.0)
RBC: 5.09 Mil/uL (ref 4.22–5.81)
RDW: 14.1 % (ref 11.5–15.5)
WBC: 5.4 10*3/uL (ref 4.0–10.5)

## 2021-03-23 LAB — BASIC METABOLIC PANEL
BUN: 13 mg/dL (ref 6–23)
CO2: 28 mEq/L (ref 19–32)
Calcium: 9.7 mg/dL (ref 8.4–10.5)
Chloride: 102 mEq/L (ref 96–112)
Creatinine, Ser: 0.95 mg/dL (ref 0.40–1.50)
GFR: 87.18 mL/min (ref >60.00)
Glucose, Bld: 138 mg/dL — ABNORMAL HIGH (ref 70–99)
Potassium: 4 mEq/L (ref 3.5–5.1)
Sodium: 138 mEq/L (ref 135–145)

## 2021-03-23 LAB — HEMOGLOBIN A1C: Hgb A1c MFr Bld: 6.3 % (ref 4.6–6.5)

## 2021-03-23 NOTE — Progress Notes (Signed)
Subjective:    Patient ID: Joe Blanchard, male    DOB: 27-Jan-1961, 60 y.o.   MRN: [id number]  DOS:  03/23/2021 Type of visit - description: Follow-up Several issues discussed.  For several weeks has experienced episodic discomfort on the left chest, patient does not like to call it pain. Happens typically at night, at rest, no associated nausea or diaphoresis.  No radiation.  Last 15 minutes. He is not very active, but when he does yard work he has no symptoms.  Also, while he was in [place] during the trip he tested positive for [place]. At the time he was tired, sneezing and had mild cough.  He feels 100% recuperated.  Of note this, chest discomfort started before his [place] trip.  BP slightly elevated at home  BP Readings from Last 3 Encounters:  03/23/21 124/76  11/05/20 129/84  09/24/20 106/71   Review of Systems Denies cough at this point No GERD No fever   Past Medical History:  Diagnosis Date   Anxiety    Arthritis    Colon polyps    Cscope 11-11, next 06-2011   Hypertension    on meds    Hypertriglyceridemia    a. 11/2013 - 240.   Prediabetes 06/24/2011   Sleep apnea    will start CPAP once can get a machine     Past Surgical History:  Procedure Laterality Date   COLONOSCOPY     KNEE SURGERY  12/03/08   scope   POLYPECTOMY     SCALP LACERATION REPAIR  ~09-2013    Allergies as of 03/23/2021   No Known Allergies      Medication List        Accurate as of March 23, 2021 10:53 AM. If you have any questions, ask your nurse or doctor.          acetaminophen 500 MG tablet Commonly known as: TYLENOL Take 500 mg by mouth every 6 (six) hours as needed. Reported on 09/23/2015   ALPRAZolam 0.5 MG tablet Commonly known as: XANAX TAKE ONE TABLET BY MOUTH AT BEDTIME AS NEEDED   amLODipine 5 MG tablet Commonly known as: NORVASC Take 1 tablet (5 mg total) by mouth every evening.   atorvastatin 20 MG tablet Commonly known as: LIPITOR TAKE ONE TABLET  BY MOUTH DAILY AT BEDTIME   DULoxetine 30 MG capsule Commonly known as: CYMBALTA Take 1 capsule (30 mg total) by mouth daily.   hydrocortisone 25 MG suppository Commonly known as: ANUSOL-HC Place 1 suppository (25 mg total) rectally 2 (two) times daily as needed.   zolpidem 10 MG tablet Commonly known as: AMBIEN TAKE HALF TO ONE TABLET BY MOUTH AT BEDTIME AS NEEDED for sleep           Objective:   Physical Exam BP 124/76 (BP Location: Left Arm, Patient Position: Sitting, Cuff Size: Normal)   Pulse 67   Temp 98.2 F (36.8 C) (Oral)   Resp 16   Ht '5\' 11"'$  (1.803 m)   Wt 232 lb 2 oz (105.3 kg)   SpO2 98%   BMI 32.37 kg/m  General:   Well developed, NAD, BMI noted. HEENT:  Normocephalic . Face symmetric, atraumatic Lungs:  CTA B Normal respiratory effort, no intercostal retractions, no accessory muscle use. Heart: RRR,  no murmur.  Lower extremities: no pretibial edema bilaterally  Skin: Not pale. Not jaundice Neurologic:  alert & oriented X3.  Speech normal, gait appropriate for age and unassisted Psych--  Cognition  and judgment appear intact.  Cooperative with normal attention span and concentration.  Behavior appropriate. No anxious or depressed appearing.      Assessment    Assessment: Prediabetes HTN: DX 07/2020 Dyslipidemia, high TG DJD: Mostly at the knees Anxiety, insomnia: 2012: Rx citalopram --> switch to prozac 09-2012 d/t fatigue and no improvment, switch to cymbalta 08-2014 w/ great results Insomnia (on ambien and/or xanax prn) Hypogonadism- saw endo 2014, took clomid, T level normalized but pt d/c meds as he did not feel subjectively better  Chest pain, stress test -2015 Severe OSA, HST 08/19/2020 COVID infection 02-2021   PLAN Chest pain: As described above. Patient has prediabetes, HTN, high cholesterol.  No tobacco.  Jon Gills has a history of CAD but no details available. Symptoms started before recent trip to [place] and the diagnosis of  COVID from which he feels 100% recuperated Had a negative a stress test 2015 EKG today: NSR, no acute changes. Plan: Chest x-ray, CBC, stress test Hyperglycemia: Check A1c HTN: Occasional elevated readings at home, on amlodipine, readings at the office always very good.  Check a BMP. Hyperlipidemia: Last LDL 91, on atorvastatin 20 mg RTC 3 months CPX   This visit occurred during the SARS-CoV-2 public health emergency.  Safety protocols were in place, including screening questions prior to the visit, additional usage of staff PPE, and extensive cleaning of exam room while observing appropriate contact time as indicated for disinfecting solutions.

## 2021-03-23 NOTE — Patient Instructions (Signed)
Check the  blood pressure to 3 times a week. BP GOAL is between 110/65 and  135/85. Call with your blood pressure readings in 2 weeks.  If you are not controlled, will ask you to come back to be checked here.    HOW TO TAKE YOUR BLOOD PRESSURE:   Rest 5 minutes before taking your blood pressure.   Don't smoke or drink caffeinated beverages for at least 30 minutes before.   Take your blood pressure before (not after) you eat.   Sit comfortably with your back supported and both feet on the floor (don't cross your legs).   Elevate your arm to heart level on a table or a desk.   Use the proper sized cuff. It should fit smoothly and snugly around your bare upper arm. There should be enough room to slip a fingertip under the cuff. The bottom edge of the cuff should be 1 inch above the crease of the elbow.   Ideally, take 3 measurements at one sitting and record the average.    GO TO THE LAB : Get the blood work     [place], [place] Come back for a physical exam by November   STOP BY THE FIRST FLOOR:  get the XR

## 2021-03-24 ENCOUNTER — Telehealth (HOSPITAL_COMMUNITY): Payer: Self-pay | Admitting: *Deleted

## 2021-03-24 NOTE — Assessment & Plan Note (Signed)
Chest pain: As described above. Patient has prediabetes, HTN, high cholesterol.  No tobacco.  Jon Gills has a history of CAD but no details available. Symptoms started before recent trip to [place] and the diagnosis of COVID from which he feels 100% recuperated Had a negative a stress test 2015 EKG today: NSR, no acute changes. Plan: Chest x-ray, CBC, stress test Hyperglycemia: Check A1c HTN: Occasional elevated readings at home, on amlodipine, readings at the office always very good.  Check a BMP. Hyperlipidemia: Last LDL 91, on atorvastatin 20 mg RTC 3 months CPX

## 2021-03-24 NOTE — Telephone Encounter (Signed)
Patient given detailed instructions per Myocardial Perfusion Study Information Sheet for the test on  03/25/21. Patient notified to arrive 15 minutes early and that it is imperative to arrive on time for appointment to keep from having the test rescheduled.  If you need to cancel or reschedule your appointment, please call the office within 24 hours of your appointment. . Patient verbalized understanding. Kirstie Peri

## 2021-03-25 ENCOUNTER — Ambulatory Visit (HOSPITAL_COMMUNITY): Payer: BC Managed Care – PPO | Attending: Cardiovascular Disease

## 2021-03-25 ENCOUNTER — Other Ambulatory Visit: Payer: Self-pay

## 2021-03-25 VITALS — Ht 71.0 in | Wt 232.0 lb

## 2021-03-25 DIAGNOSIS — R079 Chest pain, unspecified: Secondary | ICD-10-CM | POA: Insufficient documentation

## 2021-03-25 DIAGNOSIS — R9439 Abnormal result of other cardiovascular function study: Secondary | ICD-10-CM | POA: Diagnosis not present

## 2021-03-25 LAB — MYOCARDIAL PERFUSION IMAGING
LV dias vol: 104 mL (ref 62–150)
LV sys vol: 52 mL
Peak HR: 86 {beats}/min
Rest HR: 50 {beats}/min
SDS: 0
SRS: 0
SSS: 0
TID: 1.08

## 2021-03-25 MED ORDER — TECHNETIUM TC 99M TETROFOSMIN IV KIT
32.8000 | PACK | Freq: Once | INTRAVENOUS | Status: AC | PRN
  Administered 2021-03-25: 32.8 via INTRAVENOUS
  Filled 2021-03-25: qty 33

## 2021-03-25 MED ORDER — REGADENOSON 0.4 MG/5ML IV SOLN
0.4000 mg | Freq: Once | INTRAVENOUS | Status: AC
  Administered 2021-03-25: 0.4 mg via INTRAVENOUS

## 2021-03-25 MED ORDER — TECHNETIUM TC 99M TETROFOSMIN IV KIT
11.0000 | PACK | Freq: Once | INTRAVENOUS | Status: AC | PRN
  Administered 2021-03-25: 11 via INTRAVENOUS
  Filled 2021-03-25: qty 11

## 2021-03-29 DIAGNOSIS — G4733 Obstructive sleep apnea (adult) (pediatric): Secondary | ICD-10-CM | POA: Diagnosis not present

## 2021-04-07 DIAGNOSIS — L570 Actinic keratosis: Secondary | ICD-10-CM | POA: Diagnosis not present

## 2021-04-15 ENCOUNTER — Other Ambulatory Visit: Payer: Self-pay

## 2021-04-15 ENCOUNTER — Ambulatory Visit (HOSPITAL_COMMUNITY): Payer: BC Managed Care – PPO | Attending: Cardiology

## 2021-04-15 DIAGNOSIS — R931 Abnormal findings on diagnostic imaging of heart and coronary circulation: Secondary | ICD-10-CM

## 2021-04-15 DIAGNOSIS — R9439 Abnormal result of other cardiovascular function study: Secondary | ICD-10-CM | POA: Diagnosis not present

## 2021-04-15 DIAGNOSIS — R079 Chest pain, unspecified: Secondary | ICD-10-CM | POA: Diagnosis not present

## 2021-04-15 LAB — ECHOCARDIOGRAM COMPLETE
Area-P 1/2: 4.8 cm2
S' Lateral: 3.4 cm

## 2021-04-29 DIAGNOSIS — G4733 Obstructive sleep apnea (adult) (pediatric): Secondary | ICD-10-CM | POA: Diagnosis not present

## 2021-05-05 ENCOUNTER — Other Ambulatory Visit: Payer: Self-pay | Admitting: Internal Medicine

## 2021-05-29 DIAGNOSIS — G4733 Obstructive sleep apnea (adult) (pediatric): Secondary | ICD-10-CM | POA: Diagnosis not present

## 2021-06-07 DIAGNOSIS — E781 Pure hyperglyceridemia: Secondary | ICD-10-CM | POA: Insufficient documentation

## 2021-06-07 DIAGNOSIS — K635 Polyp of colon: Secondary | ICD-10-CM | POA: Insufficient documentation

## 2021-06-10 NOTE — Progress Notes (Signed)
Cardiology Office Note:    Date:  06/11/2021   ID:  Joe Blanchard, DOB 1960-08-18, MRN [id number]  PCP:  Colon Branch, MD  Cardiologist:  Shirlee More, MD   Referring MD: Colon Branch, MD  ASSESSMENT:    1. Abnormal myocardial perfusion study   2. Chest pain of uncertain etiology   3. Primary hypertension   4. Dyslipidemia    PLAN:    In order of problems listed above:  He will undergo cardiac CTA to define the presence or absence of CAD and whether he has had previous cardiac events.  Also get a coronary calcium score to guide lipid-lowering treatment and I will contact him when the results are available and we will formulate a plan of follow-up as needed Stable hypertension continue current treatment calcium channel blocker Continue with statin  Next appointment to be determined after cardiac CTA   Medication Adjustments/Labs and Tests Ordered: Current medicines are reviewed at length with the patient today.  Concerns regarding medicines are outlined above.  Orders Placed This Encounter  Procedures   CT CORONARY MORPH W/CTA COR W/SCORE W/CA W/CM &/OR WO/CM   Basic metabolic panel   EKG [id number]    Meds ordered this encounter  Medications   metoprolol tartrate (LOPRESSOR) 100 MG tablet    Sig: Take 1 tablet (100 mg total) by mouth once for 1 dose. Take two hours prior to your cardiac CT    Dispense:  1 tablet    Refill:  0      Chief Complaint  Patient presents with   Follow-up    His echocardiogram showed a low normal ejection fraction mild enlargement of the aortic root  And myocardial perfusion study showed EF of 50% with apical hypokinesia  Chief complaint: What did my testing show  History of Present Illness:    Joe Blanchard is a 60 y.o. male with hypertension and dyslipidemia who is being seen today for the evaluation of results of cardiac testing at the request of Colon Branch, MD.  He had a recent echocardiogram 04/15/2021 showing the left  ventricle global ejection fraction estimated 50 to 55% right ventricle normal size and function and mild enlargement of the aortic root 37 mm.  Had follow-up myocardial perfusion study gated performed 03/25/2021 it was a low risk study ejection fraction was 50% with apical hypokinesia mildly reduced there is no EKG or perfusion evidence of ischemia.  EKG 03/23/2021 showed sinus bradycardia otherwise normal  His story begins with an unusual sensation at times in his chest nonexertional where he feels startled but it last for a few minutes.  Not quite palpitation he checks his pulse is regular has not quite chest pain or shortness of breath.  The symptoms are not severe.  He underwent an echocardiogram which showed low normal ejection fraction very mild enlargement aortic root and a follow-up myocardial perfusion study confirms a EF mildly reduced 50% but this time apical hypokinesia although there was no perfusion image of infarction or ischemia. He has no known history of congenital rheumatic heart disease or atrial fibrillation He has no exertional symptoms of chest pain shortness of breath he has no edema.  I reviewed and the limitations of noninvasive testing not infrequently overlap between low normal and mildly abnormal.  From the testing is at done I cannot conclusively tell him whether or not he has coronary artery disease or is experienced a cardiac event.  We discussed options I think he is  best suited undergoing cardiac CTA he agrees and be set up as an outpatient.  We will determine follow-up after looking at the results he tells me he is little tired of seeing physicians and having testing performed.  If significantly abnormal I will bring him back to my office if his calcium score is 0 and he has normal coronary arteriography the issue of lipid-lowering would be debatable and he will be able to stop his statin. Past Medical History:  Diagnosis Date   Anxiety    Arthritis    Colon polyps     Cscope 11-11, next 06-2011   Hypertension    on meds    Hypertriglyceridemia    a. 11/2013 - 240.   Prediabetes 06/24/2011   Sleep apnea    will start CPAP once can get a machine     Past Surgical History:  Procedure Laterality Date   COLONOSCOPY     KNEE SURGERY  12/03/08   scope   POLYPECTOMY     SCALP LACERATION REPAIR  ~09-2013    Current Medications: Current Meds  Medication Sig   acetaminophen (TYLENOL) 500 MG tablet Take 500 mg by mouth every 6 (six) hours as needed for mild pain. Reported on 09/23/2015   ALPRAZolam (XANAX) 0.5 MG tablet TAKE ONE TABLET BY MOUTH AT BEDTIME AS NEEDED (Patient taking differently: at bedtime as needed for anxiety or sleep.)   amLODipine (NORVASC) 5 MG tablet TAKE ONE TABLET BY MOUTH DAILY IN THE EVENING   atorvastatin (LIPITOR) 20 MG tablet TAKE ONE TABLET BY MOUTH DAILY AT BEDTIME   DULoxetine (CYMBALTA) 30 MG capsule Take 1 capsule (30 mg total) by mouth daily.   metoprolol tartrate (LOPRESSOR) 100 MG tablet Take 1 tablet (100 mg total) by mouth once for 1 dose. Take two hours prior to your cardiac CT   zolpidem (AMBIEN) 10 MG tablet TAKE HALF TO ONE TABLET BY MOUTH AT BEDTIME AS NEEDED for sleep     Allergies:   Patient has no known allergies.   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       1 child at home   Social Determinants of Health   Financial Resource Strain: Not on file  Food Insecurity: Not on file  Transportation Needs: Not on file  Physical Activity: Not on file  Stress: Not on file  Social Connections: Not on file     Family History: The patient's family history includes Colon cancer in his father; Colon polyps in his brother and another family member; Dementia in his mother; Heart disease in an other family member; Lupus in his sister; Other in his father  and mother; Prostate cancer in his father. There is no history of Diabetes, Esophageal cancer, Rectal cancer, or Stomach cancer.  ROS:   ROS Please see the history of present illness.     All other systems reviewed and are negative.  EKGs/Labs/Other Studies Reviewed:    The following studies were reviewed today:   EKG:  EKG is  ordered today.  The ekg ordered today is personally reviewed and demonstrates sinus rhythm 1 PVC otherwise normal  Recent Labs: 06/17/2020: ALT 40; TSH 4.130 03/23/2021: BUN 13; Creatinine, Ser 0.95; Hemoglobin 14.6; Platelets 174.0; Potassium 4.0; Sodium 138  Recent Lipid Panel    Component Value Date/Time   CHOL 202 (H) 06/17/2020 1427   TRIG 383 (H) 06/17/2020 1427   HDL 48 06/17/2020 1427   CHOLHDL 4.2 06/17/2020 1427   CHOLHDL 6 06/04/2018 1341   VLDL 62.0 (H) 06/04/2018 1341   LDLCALC 91 06/17/2020 1427   LDLDIRECT 143.0 06/04/2018 1341    Physical Exam:    VS:  BP 124/88   Pulse 78   Ht 5\' 11"  (1.803 m)   Wt 231 lb (104.8 kg)   SpO2 99%   BMI 32.22 kg/m     Wt Readings from Last 3 Encounters:  06/11/21 231 lb (104.8 kg)  03/25/21 232 lb (105.2 kg)  03/23/21 232 lb 2 oz (105.3 kg)     GEN:  Well nourished, well developed in no acute distress HEENT: Normal NECK: No JVD; No carotid bruits LYMPHATICS: No lymphadenopathy CARDIAC: RRR, no murmurs, rubs, gallops RESPIRATORY:  Clear to auscultation without rales, wheezing or rhonchi   ABDOMEN: Soft, non-tender, non-distended MUSCULOSKELETAL:  No edema; No deformity  SKIN: Warm and dry NEUROLOGIC:  Alert and oriented x 3 PSYCHIATRIC:  Normal affect     Signed, Shirlee More, MD  06/11/2021 2:21 PM    [facility]

## 2021-06-11 ENCOUNTER — Ambulatory Visit (INDEPENDENT_AMBULATORY_CARE_PROVIDER_SITE_OTHER): Payer: BC Managed Care – PPO | Admitting: Cardiology

## 2021-06-11 ENCOUNTER — Encounter: Payer: Self-pay | Admitting: Cardiology

## 2021-06-11 ENCOUNTER — Other Ambulatory Visit: Payer: Self-pay

## 2021-06-11 VITALS — BP 124/88 | HR 78 | Ht 71.0 in | Wt 231.0 lb

## 2021-06-11 DIAGNOSIS — R079 Chest pain, unspecified: Secondary | ICD-10-CM | POA: Diagnosis not present

## 2021-06-11 DIAGNOSIS — E785 Hyperlipidemia, unspecified: Secondary | ICD-10-CM

## 2021-06-11 DIAGNOSIS — I10 Essential (primary) hypertension: Secondary | ICD-10-CM

## 2021-06-11 DIAGNOSIS — R9439 Abnormal result of other cardiovascular function study: Secondary | ICD-10-CM

## 2021-06-11 MED ORDER — METOPROLOL TARTRATE 100 MG PO TABS: 100.0000 mg | ORAL_TABLET | Freq: Once | ORAL | 0 refills | Status: DC

## 2021-06-11 NOTE — Patient Instructions (Signed)
Medication Instructions:  Your physician recommends that you continue on your current medications as directed. Please refer to the Current Medication list given to you today.  *If you need a refill on your cardiac medications before your next appointment, please call your pharmacy*   Lab Work: Your physician recommends that you return for lab work in: TODAY BMP If you have labs (blood work) drawn today and your tests are completely normal, you will receive your results only by: [place] (if you have MyChart) OR A paper copy in the mail If you have any lab test that is abnormal or we need to change your treatment, we will call you to review the results.   Testing/Procedures:   Your cardiac CT will be scheduled at the below location:   [facility] [address] [place], [place] [address] [phone]  If scheduled at [facility], please arrive at the [facility] main entrance (entrance A) of [facility] 30 minutes prior to test start time. You can use the FREE valet parking offered at the main entrance (encouraged to control the heart rate for the test) Proceed to the [facility] Radiology Department (first floor) to check-in and test prep.  Please follow these instructions carefully (unless otherwise directed):  On the Night Before the Test: Be sure to Drink plenty of water. Do not consume any caffeinated/decaffeinated beverages or chocolate 12 hours prior to your test. Do not take any antihistamines 12 hours prior to your test.  On the Day of the Test: Drink plenty of water until 1 hour prior to the test. Do not eat any food 4 hours prior to the test. You may take your regular medications prior to the test.  Take metoprolol (Lopressor) two hours prior to test.  After the Test: Drink plenty of water. After receiving IV contrast, you may experience a mild flushed feeling. This is normal. On occasion, you may experience a mild rash up to  24 hours after the test. This is not dangerous. If this occurs, you can take Benadryl 25 mg and increase your fluid intake. If you experience trouble breathing, this can be serious. If it is severe call 911 IMMEDIATELY. If it is mild, please call our office. If you take any of these medications: Glipizide/Metformin, Avandament, Glucavance, please do not take 48 hours after completing test unless otherwise instructed.  Please allow 2-4 weeks for scheduling of routine cardiac CTs. Some insurance companies require a pre-authorization which may delay scheduling of this test.   For non-scheduling related questions, please contact the cardiac imaging nurse navigator should you have any questions/concerns: Marchia Bond, Cardiac Imaging Nurse Navigator Gordy Clement, Cardiac Imaging Nurse Navigator [facility] Direct Office Dial: [phone]   For scheduling needs, including cancellations and rescheduling, please call [place], [phone].,   Follow-Up: At [facility], you and your health needs are our priority.  As part of our continuing mission to provide you with exceptional heart care, we have created designated Provider Care Teams.  These Care Teams include your primary Cardiologist (physician) and Advanced Practice Providers (APPs -  Physician Assistants and Nurse Practitioners) who all work together to provide you with the care you need, when you need it.  We recommend signing up for the patient portal called "MyChart".  Sign up information is provided on this After Visit Summary.  MyChart is used to connect with patients for Virtual Visits (Telemedicine).  Patients are able to view lab/test results, encounter  notes, upcoming appointments, etc.  Non-urgent messages can be sent to your provider as well.   To learn more about what you can do with MyChart, go to [email or web address].    Your next appointment:   As needed  The format for your next appointment:    In Person  Provider:   Shirlee More, MD   Other Instructions

## 2021-06-12 LAB — BASIC METABOLIC PANEL
BUN/Creatinine Ratio: 19 (ref 10–24)
BUN: 19 mg/dL (ref 8–27)
CO2: 24 mmol/L (ref 20–29)
Calcium: 10.3 mg/dL — ABNORMAL HIGH (ref 8.6–10.2)
Chloride: 99 mmol/L (ref 96–106)
Creatinine, Ser: 0.98 mg/dL (ref 0.76–1.27)
Glucose: 109 mg/dL — ABNORMAL HIGH (ref 70–99)
Potassium: 4.5 mmol/L (ref 3.5–5.2)
Sodium: 140 mmol/L (ref 134–144)
eGFR: 88 mL/min/{1.73_m2} (ref >59)

## 2021-06-15 ENCOUNTER — Telehealth: Payer: Self-pay

## 2021-06-15 NOTE — Telephone Encounter (Signed)
Spoke with patient regarding results and recommendation.  Patient verbalizes understanding and is agreeable to plan of care. Advised patient to call back with any issues or concerns.  

## 2021-06-15 NOTE — Telephone Encounter (Signed)
-----   Message from Richardo Priest, MD sent at 06/15/2021  1:07 PM EDT ----- Normal or stable result

## 2021-06-21 ENCOUNTER — Telehealth (HOSPITAL_COMMUNITY): Payer: Self-pay | Admitting: *Deleted

## 2021-06-21 NOTE — Telephone Encounter (Signed)
Reaching out to patient to offer assistance regarding upcoming cardiac imaging study; pt verbalizes understanding of appt date/time, parking situation and where to check in, pre-test NPO status and medications ordered, and verified current allergies; name and call back number provided for further questions should they arise ° °Gregery Walberg RN Navigator Cardiac Imaging °[facility] °[phone] office °[phone] cell  ° °Patient to take 100mg metoprolol tartrate two hours prior to cardiac CT scan. °

## 2021-06-22 ENCOUNTER — Encounter: Payer: BC Managed Care – PPO | Admitting: Internal Medicine

## 2021-06-22 ENCOUNTER — Ambulatory Visit (HOSPITAL_COMMUNITY)
Admission: RE | Admit: 2021-06-22 | Discharge: 2021-06-22 | Disposition: A | Discharge status: Home / Self Care | Payer: BC Managed Care – PPO | Source: Ambulatory Visit | Attending: Cardiology | Admitting: Cardiology

## 2021-06-22 ENCOUNTER — Other Ambulatory Visit: Payer: Self-pay

## 2021-06-22 DIAGNOSIS — R079 Chest pain, unspecified: Secondary | ICD-10-CM | POA: Diagnosis not present

## 2021-06-22 DIAGNOSIS — G4733 Obstructive sleep apnea (adult) (pediatric): Secondary | ICD-10-CM | POA: Diagnosis not present

## 2021-06-22 MED ORDER — NITROGLYCERIN 0.4 MG SL SUBL: 0.8000 mg | SUBLINGUAL_TABLET | Freq: Once | SUBLINGUAL | Status: AC

## 2021-06-22 MED ORDER — IOHEXOL 350 MG/ML SOLN
100.0000 mL | Freq: Once | INTRAVENOUS | Status: AC | PRN
  Administered 2021-06-22: 100 mL via INTRAVENOUS

## 2021-06-22 MED ORDER — NITROGLYCERIN 0.4 MG SL SUBL
SUBLINGUAL_TABLET | SUBLINGUAL | Status: AC
  Administered 2021-06-22: 0.8 mg via SUBLINGUAL
  Filled 2021-06-22: qty 2

## 2021-06-24 ENCOUNTER — Telehealth: Payer: Self-pay

## 2021-06-24 NOTE — Telephone Encounter (Signed)
Spoke with patient regarding results and recommendation.  Patient verbalizes understanding and is agreeable to plan of care. Advised patient to call back with any issues or concerns.  

## 2021-06-24 NOTE — Telephone Encounter (Signed)
-----   Message from Richardo Priest, MD sent at 06/24/2021  2:01 PM EST ----- I think that cardiac CTA is helpful  His coronary calcium score which is a measure of atherosclerosis is elevated greater than 75th percentile for age greater than 22 and he needs to remain on a statin  His thoracic aorta is normal there is no aneurysm or enlargement  There is mild plaque in 2 of his coronary vessels it does not restrict blood flow findings of heart attack and does not need anything like a cardiac stent performed  Can discuss further in the office in follow-up.

## 2021-07-06 ENCOUNTER — Ambulatory Visit (INDEPENDENT_AMBULATORY_CARE_PROVIDER_SITE_OTHER): Payer: BC Managed Care – PPO | Admitting: Adult Health

## 2021-07-06 ENCOUNTER — Encounter: Payer: Self-pay | Admitting: Adult Health

## 2021-07-06 VITALS — BP 115/75 | HR 66 | Ht 71.0 in | Wt 232.4 lb

## 2021-07-06 DIAGNOSIS — Z9989 Dependence on other enabling machines and devices: Secondary | ICD-10-CM

## 2021-07-06 DIAGNOSIS — G4733 Obstructive sleep apnea (adult) (pediatric): Secondary | ICD-10-CM | POA: Diagnosis not present

## 2021-07-06 NOTE — Progress Notes (Signed)
PATIENT: Joe Blanchard DOB: October 26, 1960  REASON FOR VISIT: follow up HISTORY FROM: patient   HISTORY OF PRESENT ILLNESS: Today 07/06/21:  Joe Blanchard is a 60 year old male with a history of obstructive sleep apnea on CPAP.  He returns today for follow-up.  His download indicates that he uses machine 31 out of 32 days for compliance of 96.9%.  On average he uses his machine 8 hours and 55 minutes.  His residual AHI is 1.9 on 6 to 18 cm of water.  He reports that the CPAP continues to work well for him.   REVIEW OF SYSTEMS: Out of a complete 14 system review of symptoms, the patient complains only of the following symptoms, and all other reviewed systems are negative.   ESS 6  ALLERGIES: No Known Allergies  HOME MEDICATIONS: Outpatient Medications Prior to Visit  Medication Sig Dispense Refill   acetaminophen (TYLENOL) 500 MG tablet Take 500 mg by mouth every 6 (six) hours as needed for mild pain. Reported on 09/23/2015     ALPRAZolam (XANAX) 0.5 MG tablet TAKE ONE TABLET BY MOUTH AT BEDTIME AS NEEDED (Patient taking differently: at bedtime as needed for anxiety or sleep.) 30 tablet 1   amLODipine (NORVASC) 5 MG tablet TAKE ONE TABLET BY MOUTH DAILY IN THE EVENING 90 tablet 1   atorvastatin (LIPITOR) 20 MG tablet TAKE ONE TABLET BY MOUTH DAILY AT BEDTIME 90 tablet 1   DULoxetine (CYMBALTA) 30 MG capsule Take 1 capsule (30 mg total) by mouth daily. 30 capsule 5   zolpidem (AMBIEN) 10 MG tablet TAKE HALF TO ONE TABLET BY MOUTH AT BEDTIME AS NEEDED for sleep 30 tablet 2   metoprolol tartrate (LOPRESSOR) 100 MG tablet Take 1 tablet (100 mg total) by mouth once for 1 dose. Take two hours prior to your cardiac CT 1 tablet 0   No facility-administered medications prior to visit.    PAST MEDICAL HISTORY: Past Medical History:  Diagnosis Date   Anxiety    Arthritis    Colon polyps    Cscope 11-11, next 06-2011   Hypertension    on meds    Hypertriglyceridemia    a. 11/2013 -  240.   Prediabetes 06/24/2011   Sleep apnea    will start CPAP once can get a machine     PAST SURGICAL HISTORY: Past Surgical History:  Procedure Laterality Date   COLONOSCOPY     KNEE SURGERY  12/03/08   scope   POLYPECTOMY     SCALP LACERATION REPAIR  ~09-2013    FAMILY HISTORY: Family History  Problem Relation Age of Onset   Other Mother    Dementia Mother    Colon cancer Father        dx in his 33s   Prostate cancer Father        dx in his 72s?   Other Father        died suddenly @ 79.   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       1 child at home   Social Determinants of Health   Financial Resource Strain: Not on file  Food Insecurity: Not on file  Transportation Needs: Not on file  Physical Activity: Not on file  Stress: Not on file  Social Connections: Not on file  Intimate Partner Violence: Not on file      PHYSICAL EXAM  Vitals:   07/06/21 1412  BP: 115/75  Pulse: 66  Weight: 232 lb 6.4 oz (105.4 kg)  Height: 5'  11" (1.803 m)   Body mass index is 32.41 kg/m.  Generalized: Well developed, in no acute distress  Chest: Lungs clear to auscultation bilaterally  Neurological examination  Mentation: Alert oriented to time, place, history taking. Follows all commands speech and language fluent Cranial nerve II-XII: Extraocular movements were full, visual field were full on confrontational test Head turning and shoulder shrug  were normal and symmetric. Motor: The motor testing reveals 5 over 5 strength of all 4 extremities. Good symmetric motor tone is noted throughout.  Sensory: Sensory testing is intact to soft touch on all 4 extremities. No evidence of extinction is noted.  Gait and station: Gait is normal.    DIAGNOSTIC DATA (LABS, IMAGING, TESTING) - I reviewed patient records, labs, notes, testing and imaging myself where available.  Lab Results  Component Value Date   WBC 5.4 03/23/2021   HGB 14.6 03/23/2021   HCT 44.1 03/23/2021   MCV 86.7 03/23/2021   PLT 174.0 03/23/2021      Component Value Date/Time   NA 140 06/11/2021 1427   K 4.5 06/11/2021 1427   CL 99 06/11/2021 1427   CO2 24 06/11/2021 1427   GLUCOSE 109 (H) 06/11/2021 1427   GLUCOSE 138 (H) 03/23/2021 1130   GLUCOSE 101 12/16/2008 0000   BUN 19 06/11/2021 1427   CREATININE 0.98 06/11/2021 1427   CALCIUM 10.3 (H) 06/11/2021 1427   PROT 7.3 06/17/2020 1427   ALBUMIN 4.7 06/17/2020 1427   AST 26 06/17/2020 1427   ALT 40 06/17/2020 1427   ALKPHOS 95 06/17/2020 1427   BILITOT 0.5 06/17/2020 1427   GFRNONAA 94 06/17/2020 1427   GFRAA 109 06/17/2020 1427   Lab Results  Component Value Date   CHOL 202 (H) 06/17/2020   HDL 48 06/17/2020   LDLCALC 91 06/17/2020   LDLDIRECT 143.0 06/04/2018   TRIG 383 (H) 06/17/2020   CHOLHDL 4.2 06/17/2020   Lab Results  Component Value Date   HGBA1C 6.3 03/23/2021   Lab Results  Component Value Date   HOZYYQMG50 037 02/03/2016   Lab Results  Component Value Date   TSH 4.130  06/17/2020  ASSESSMENT AND PLAN 60 y.o. year old male  has a past medical history of Anxiety, Arthritis, Colon polyps, Hypertension, Hypertriglyceridemia, Prediabetes (06/24/2011), and Sleep apnea. here with:  OSA on CPAP  - CPAP compliance excellent - Good treatment of AHI  - Encourage patient to use CPAP nightly and > 4 hours each night - F/U in 1 year or sooner if needed    Ward Givens, MSN, NP-C 07/06/2021, 2:42 PM [facility] [address] [place], [place] [address] [phone]

## 2021-07-22 DIAGNOSIS — G4733 Obstructive sleep apnea (adult) (pediatric): Secondary | ICD-10-CM | POA: Diagnosis not present

## 2021-07-26 ENCOUNTER — Telehealth: Payer: Self-pay | Admitting: Internal Medicine

## 2021-07-26 NOTE — Telephone Encounter (Signed)
Requesting: Ambien 10mg   Contract: 06/17/2020 UDS: 06/17/2020 Last Visit: 03/23/2021 Next Visit: 08/25/2021 Last Refill: 01/29/2021 #30 and 2RF  Please Advise

## 2021-07-26 NOTE — Telephone Encounter (Signed)
PDMP okay, Rx sent 

## 2021-08-22 DIAGNOSIS — G4733 Obstructive sleep apnea (adult) (pediatric): Secondary | ICD-10-CM | POA: Diagnosis not present

## 2021-08-25 ENCOUNTER — Ambulatory Visit (INDEPENDENT_AMBULATORY_CARE_PROVIDER_SITE_OTHER): Payer: BLUE CROSS/BLUE SHIELD | Admitting: Internal Medicine

## 2021-08-25 ENCOUNTER — Encounter: Payer: Self-pay | Admitting: Internal Medicine

## 2021-08-25 VITALS — BP 126/70 | HR 66 | Temp 98.1°F | Resp 16 | Ht 71.0 in | Wt 231.5 lb

## 2021-08-25 DIAGNOSIS — Z0001 Encounter for general adult medical examination with abnormal findings: Secondary | ICD-10-CM | POA: Diagnosis not present

## 2021-08-25 DIAGNOSIS — Z0000 Encounter for general adult medical examination without abnormal findings: Secondary | ICD-10-CM

## 2021-08-25 DIAGNOSIS — Z23 Encounter for immunization: Secondary | ICD-10-CM | POA: Diagnosis not present

## 2021-08-25 DIAGNOSIS — I10 Essential (primary) hypertension: Secondary | ICD-10-CM | POA: Diagnosis not present

## 2021-08-25 DIAGNOSIS — R739 Hyperglycemia, unspecified: Secondary | ICD-10-CM | POA: Diagnosis not present

## 2021-08-25 DIAGNOSIS — E785 Hyperlipidemia, unspecified: Secondary | ICD-10-CM

## 2021-08-25 DIAGNOSIS — F419 Anxiety disorder, unspecified: Secondary | ICD-10-CM | POA: Diagnosis not present

## 2021-08-25 DIAGNOSIS — Z79899 Other long term (current) drug therapy: Secondary | ICD-10-CM

## 2021-08-25 NOTE — Progress Notes (Signed)
Subjective:    Patient ID: Joe Blanchard, male    DOB: [birth year], 61 y.o.   MRN: [id number]  DOS:  08/25/2021 Type of visit - description: cpx  Since the last office visit is doing well. Denies further chest pain. No GU symptoms.  Review of Systems  Other than above, a 14 point review of systems is negative       Past Medical History:  Diagnosis Date   Anxiety    Arthritis    Colon polyps    Cscope 11-11, next 06-2011   Hypertension    on meds    Hypertriglyceridemia    a. 11/2013 - 240.   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           Social Determinants of Health   Financial Resource Strain: Not on file  Food Insecurity: Not on file  Transportation Needs: Not on file  Physical Activity: Not on file  Stress: Not on file  Social Connections: Not on file  Intimate Partner Violence: Not on file    Current Outpatient Medications  Medication Instructions   acetaminophen (TYLENOL) 500 mg, Oral, Every 6 hours PRN, Reported on 09/23/2015    ALPRAZolam (XANAX) 0.5 MG tablet TAKE ONE TABLET BY MOUTH AT BEDTIME AS NEEDED   amLODipine (NORVASC) 5 MG tablet TAKE ONE TABLET BY MOUTH DAILY IN THE EVENING   aspirin EC 81 mg, Oral, Daily, Swallow whole.   atorvastatin (LIPITOR) 20 MG tablet TAKE ONE TABLET BY MOUTH DAILY AT BEDTIME   DULoxetine (CYMBALTA) 30 mg, Oral, Daily   zolpidem (AMBIEN) 10 MG tablet TAKE HALF TO ONE TABLET BY MOUTH AT BEDTIME AS NEEDED       Objective:   Physical Exam BP 126/70 (BP Location: Left Arm, Patient Position: Sitting, Cuff Size: Normal)    Pulse 66    Temp 98.1 F (36.7 C) (Oral)    Resp 16    Ht 5\' 11"  (1.803 m)    Wt 231 lb 8 oz (105 kg)    SpO2 98%    BMI 32.29 kg/m  General: Well developed, NAD, BMI noted Neck: No  thyromegaly  HEENT:  Normocephalic . Face symmetric, atraumatic Lungs:  CTA B Normal respiratory effort, no intercostal retractions, no accessory muscle use. Heart: RRR,  no murmur.  Abdomen:  Not distended, soft, non-tender. No rebound or rigidity.   Lower extremities: no pretibial edema bilaterally DRE: Prostate is soft, nontender, normal size.  Again at the right side there is very very small (1-2 mm) pellet-like prominence.  Compared to last year that I think is less noticeable Skin: Exposed areas without rash. Not pale. Not jaundice Neurologic:  alert & oriented X3.  Speech normal, gait appropriate for age and unassisted Strength symmetric and appropriate for age.  Psych: Cognition and judgment appear intact.  Cooperative with normal attention span and concentration.  Behavior appropriate. No anxious or depressed appearing.     Assessment     Assessment: Prediabetes HTN: DX 07/2020 Dyslipidemia, high TG DJD: Mostly at the knees Anxiety, insomnia: 2012: Rx citalopram --> switch to prozac 09-2012 d/t fatigue and no improvment, switch to cymbalta 08-2014 w/ great results Insomnia (on ambien and/or xanax prn) Hypogonadism- saw endo 2014, took clomid, T level  normalized but pt d/c meds as he did not feel subjectively better  Severe OSA, HST 08/19/2020  CV: Stress test (03-2021): EF was 50% with apical hypokinesia, no evidence of ischemia, saw cardiology.  Coronary calcium score (06-2021): >  75 percentile, was rx medical therapy.   PLAN Here for CPX Chest pain: See last visit, had a myocardial perfusion study, EF was 50% with apical hypokinesia, no evidence of ischemia, saw cardiology,   they recommended CTA.  Coronary calcium score greater than 75 percentile, was recommended medical therapy. Recommend to start enteric-coated aspirin 81 mg. No further symptoms. Prediabetes: Checking labs HTN BP today is great, no change Dyslipidemia: On Lipitor.  Labs. Anxiety insomnia: Well-controlled on Cymbalta and Ambien.  Contract signed. RTC 1 year  In addition to CPX, I review cardiac work-up performed since the last visit, recommended starting aspirin. Chronic issues discussed.  This visit occurred during the SARS-CoV-2 public health emergency.  Safety protocols were in place, including screening questions prior to the visit, additional usage of staff PPE, and extensive cleaning of exam room while observing appropriate contact time as indicated for disinfecting solutions.

## 2021-08-25 NOTE — Assessment & Plan Note (Signed)
Here for CPX Chest pain: See last visit, had a myocardial perfusion study, EF was 50% with apical hypokinesia, no evidence of ischemia, saw cardiology,   they recommended CTA.  Coronary calcium score greater than 75 percentile, was recommended medical therapy. Recommend to start enteric-coated aspirin 81 mg. No further symptoms. Prediabetes: Checking labs HTN BP today is great, no change Dyslipidemia: On Lipitor.  Labs. Anxiety insomnia: Well-controlled on Cymbalta and Ambien.  Contract signed. RTC 1 year

## 2021-08-25 NOTE — Assessment & Plan Note (Signed)
-  Tdap today  - had shingrix x 2 - covid vax: rec  bivalent  booster  - Flu shot today - (+) FH of prostate cancer -->   see DRE today, very small prominence on the right side, seems to be less noticeable than last year, no GU symptoms, check PSA. - (+)  FH colon cancer, Cscope 06-2010,  08-2011, 06-2015 >> neg.  Last colonoscopy 09/24/2020.  Next per GI - Labs:  CMP, FLP, A1c, TSH, PSA --Diet exercise: Discussed - ACP package of information provided

## 2021-08-25 NOTE — Patient Instructions (Addendum)
Proceed with a Covid vaccine booster called "bivalent"  Check the  blood pressure regularly ( once a month) BP GOAL is between 110/65 and  135/85. If it is consistently higher or lower, let me know   GO TO THE LAB : Get the blood work     [place], [place] back for a physical exam in 1 year

## 2021-08-27 ENCOUNTER — Other Ambulatory Visit: Payer: Self-pay | Admitting: Internal Medicine

## 2021-08-27 LAB — COMPREHENSIVE METABOLIC PANEL
AG Ratio: 1.9 (calc) (ref 1.0–2.5)
ALT: 39 U/L (ref 9–46)
AST: 24 U/L (ref 10–35)
Albumin: 4.7 g/dL (ref 3.6–5.1)
Alkaline phosphatase (APISO): 77 U/L (ref 35–144)
BUN: 15 mg/dL (ref 7–25)
CO2: 28 mmol/L (ref 20–32)
Calcium: 9.7 mg/dL (ref 8.6–10.3)
Chloride: 103 mmol/L (ref 98–110)
Creat: 0.89 mg/dL (ref 0.70–1.35)
Globulin: 2.5 g/dL (calc) (ref 1.9–3.7)
Glucose, Bld: 109 mg/dL — ABNORMAL HIGH (ref 65–99)
Potassium: 4.2 mmol/L (ref 3.5–5.3)
Sodium: 140 mmol/L (ref 135–146)
Total Bilirubin: 0.5 mg/dL (ref 0.2–1.2)
Total Protein: 7.2 g/dL (ref 6.1–8.1)

## 2021-08-27 LAB — LIPID PANEL
Cholesterol: 165 mg/dL (ref <200)
HDL: 44 mg/dL (ref >=40)
LDL Cholesterol (Calc): 87 mg/dL (calc)
Non-HDL Cholesterol (Calc): 121 mg/dL (calc) (ref <130)
Total CHOL/HDL Ratio: 3.8 (calc) (ref <5.0)
Triglycerides: 245 mg/dL — ABNORMAL HIGH (ref <150)

## 2021-08-27 LAB — DRUG MONITORING PANEL 375977 , URINE

## 2021-08-27 LAB — HEMOGLOBIN A1C
Hgb A1c MFr Bld: 6.2 % of total Hgb — ABNORMAL HIGH (ref <5.7)
Mean Plasma Glucose: 131 mg/dL
eAG (mmol/L): 7.3 mmol/L

## 2021-08-27 LAB — PSA: PSA: 1.46 ng/mL (ref <=4.00)

## 2021-08-27 LAB — DM TEMPLATE

## 2021-08-27 LAB — TSH: TSH: 4.39 mIU/L (ref 0.40–4.50)

## 2021-08-27 MED ORDER — ATORVASTATIN CALCIUM 40 MG PO TABS: 40.0000 mg | ORAL_TABLET | Freq: Every day | ORAL | 3 refills | Status: DC

## 2021-08-27 NOTE — Addendum Note (Signed)
Addended byDamita Dunnings D on: 08/27/2021 09:02 AM   Modules accepted: Orders

## 2021-09-13 ENCOUNTER — Telehealth: Payer: Self-pay | Admitting: Internal Medicine

## 2021-09-13 NOTE — Telephone Encounter (Signed)
PDMP okay, prescription sent 

## 2021-09-13 NOTE — Telephone Encounter (Signed)
Requesting: alprazolam 0.5mg   Contract: 08/25/2021 UDS: 08/25/2021 Last Visit: 08/25/2021 Next Visit: 08/30/2022 Last Refill: 11/25/2020 #30 and 1RF  Please Advise

## 2021-10-21 ENCOUNTER — Other Ambulatory Visit: Payer: Self-pay | Admitting: Internal Medicine

## 2021-12-09 ENCOUNTER — Encounter: Payer: Self-pay | Admitting: Internal Medicine

## 2021-12-13 ENCOUNTER — Encounter: Payer: Self-pay | Admitting: Internal Medicine

## 2021-12-13 IMAGING — DX DG CHEST 2V
2 series · 2 of 2 positions shown · non-contrast
Comparison: Chest x-ray 05/02/2014.

CLINICAL DATA: 60-year-old male with history of chest pain.

EXAM:
CHEST - 2 VIEW

[chest pa]
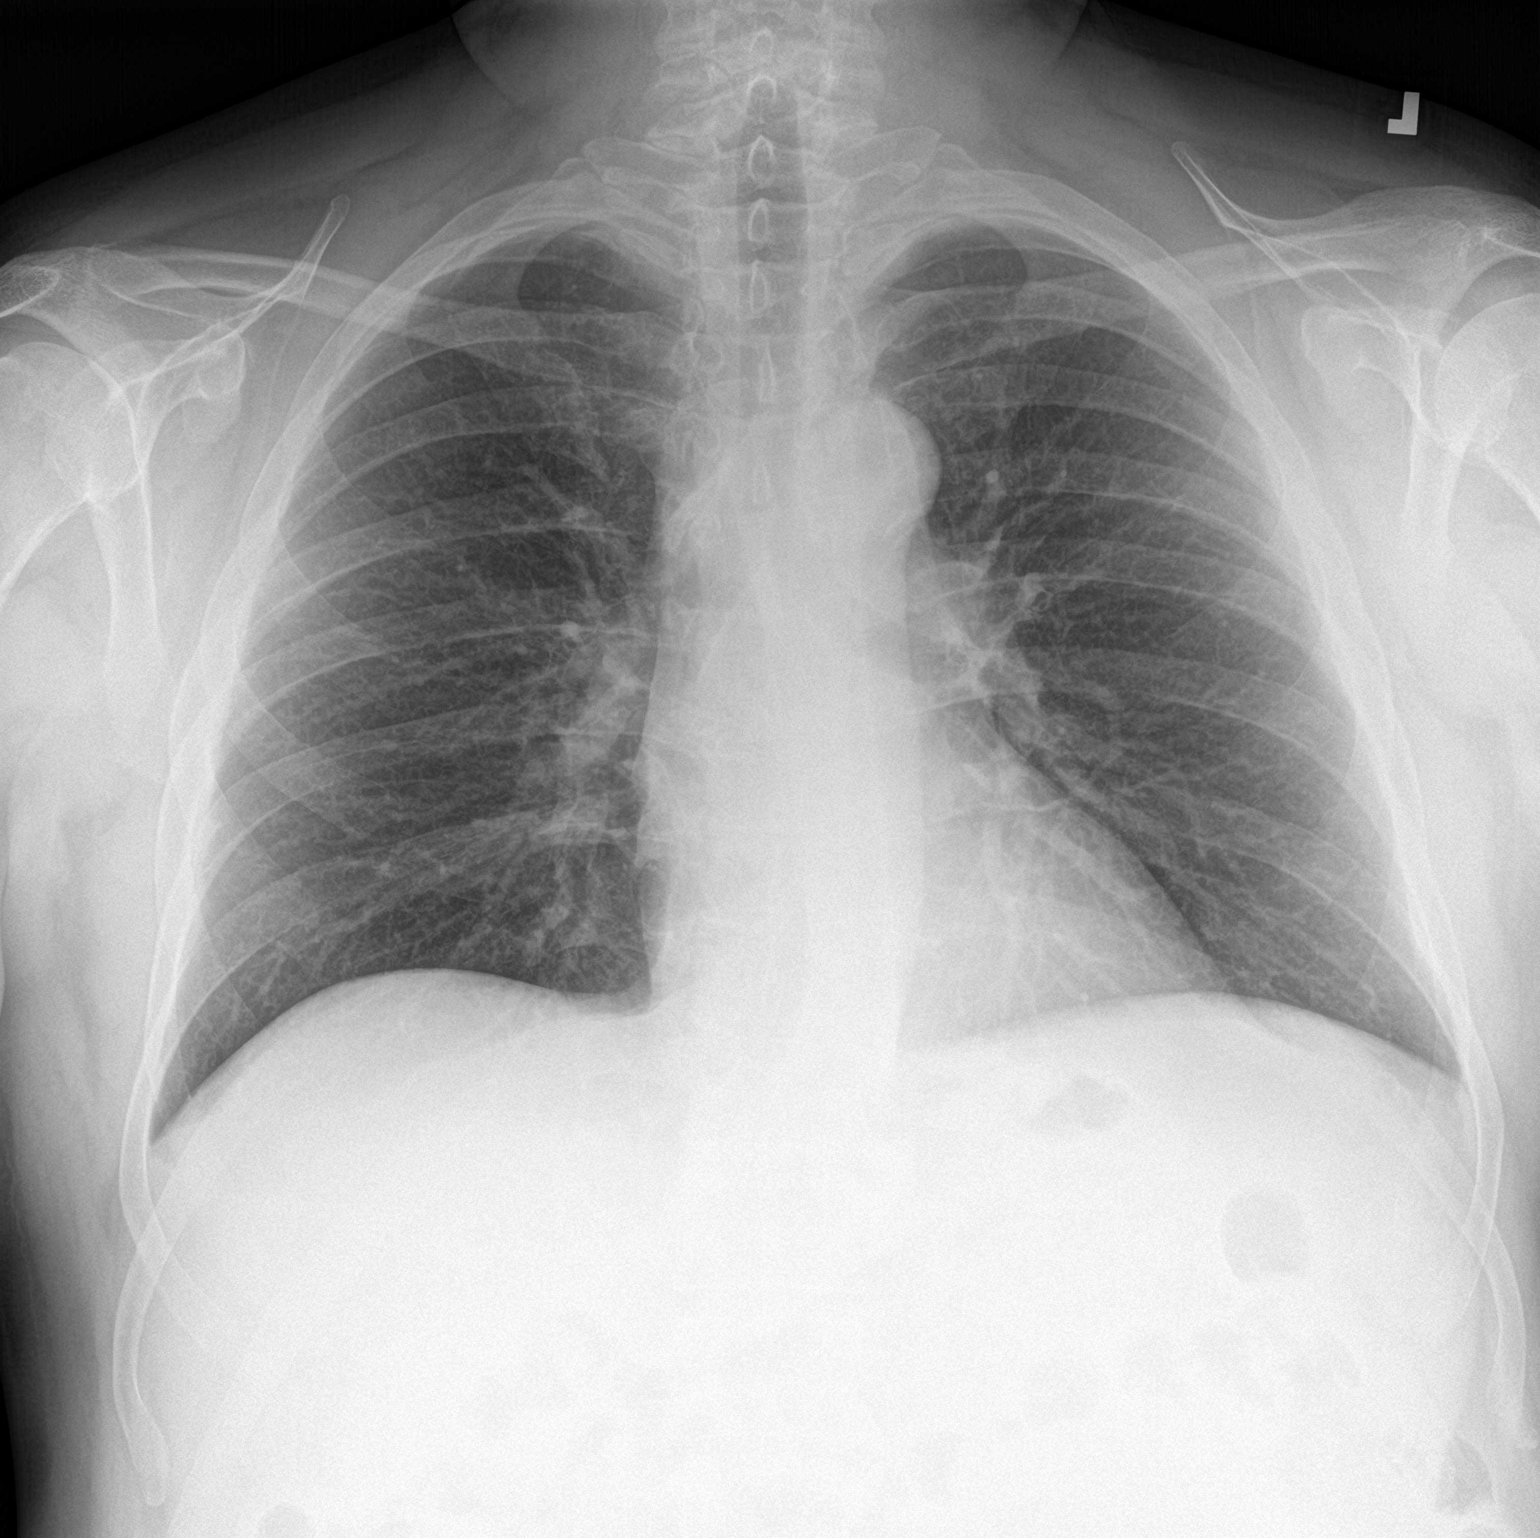

[chest lat]
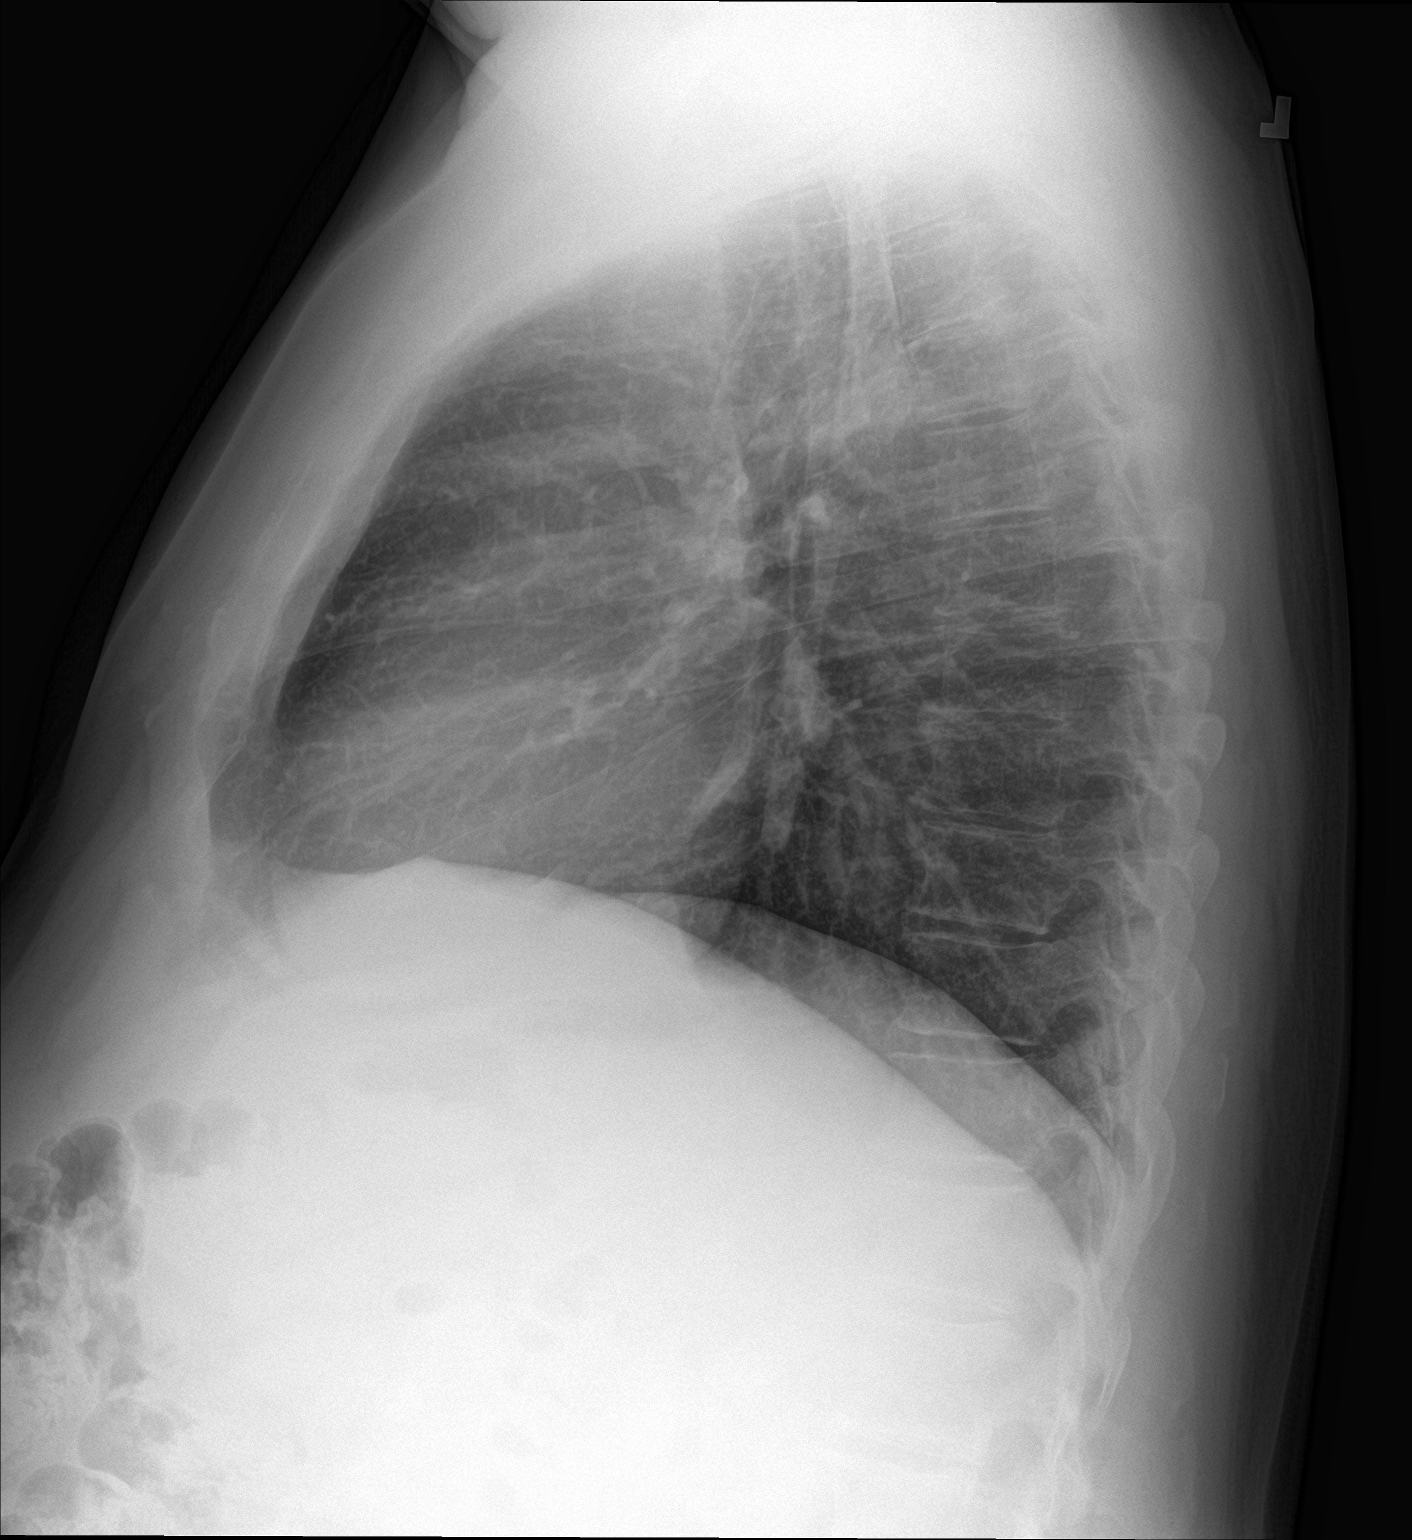

[2 of 2 positions shown; findings below may reference images not displayed]

FINDINGS: Lung volumes are normal. No consolidative airspace disease. No
pleural effusions. No pneumothorax. No pulmonary nodule or mass
noted. Pulmonary vasculature and the cardiomediastinal silhouette
are within normal limits. Atherosclerosis in the thoracic aorta.
IMPRESSION: 1.  No radiographic evidence of acute cardiopulmonary disease.
2. Aortic atherosclerosis.

## 2021-12-15 ENCOUNTER — Other Ambulatory Visit (INDEPENDENT_AMBULATORY_CARE_PROVIDER_SITE_OTHER): Payer: BLUE CROSS/BLUE SHIELD

## 2021-12-15 DIAGNOSIS — E785 Hyperlipidemia, unspecified: Secondary | ICD-10-CM

## 2021-12-16 LAB — LIPID PANEL
Cholesterol: 129 mg/dL (ref <200)
HDL: 43 mg/dL (ref >=40)
LDL Cholesterol (Calc): 63 mg/dL (calc)
Non-HDL Cholesterol (Calc): 86 mg/dL (calc) (ref <130)
Total CHOL/HDL Ratio: 3 (calc) (ref <5.0)
Triglycerides: 156 mg/dL — ABNORMAL HIGH (ref <150)

## 2021-12-16 LAB — ALT: ALT: 28 U/L (ref 9–46)

## 2021-12-16 LAB — AST: AST: 19 U/L (ref 10–35)

## 2022-01-21 ENCOUNTER — Other Ambulatory Visit: Payer: Self-pay | Admitting: Internal Medicine

## 2022-01-21 NOTE — Telephone Encounter (Signed)
Requesting: Ambien '10mg'$   Contract: 08/25/21 UDS: None Last Visit: 08/25/21 Next Visit: 08/30/22 Last Refill: 07/26/21 #30 and 2RF  Please Advise

## 2022-02-26 ENCOUNTER — Other Ambulatory Visit: Payer: Self-pay | Admitting: Internal Medicine

## 2022-03-21 ENCOUNTER — Telehealth: Payer: Self-pay | Admitting: Family Medicine

## 2022-03-21 NOTE — Telephone Encounter (Signed)
Requesting: Ambien '10mg'$   Contract: 08/25/21 UDS:  08/25/21 Last Visit: 08/25/21 Next Visit: 08/30/22 Last Refill: 01/21/22 #30 and 0RF  Please Advise

## 2022-03-21 NOTE — Telephone Encounter (Signed)
PDMP okay, Rx sent 

## 2022-04-18 ENCOUNTER — Telehealth: Payer: Self-pay | Admitting: Internal Medicine

## 2022-04-19 NOTE — Telephone Encounter (Signed)
Requesting: alprazolam 0.'5mg'$   Contract: 08/25/21 UDS: 08/25/21 Last Visit: 08/25/21 Next Visit: 08/30/22 Last Refill: 09/13/21 #30 and 1RF  Please Advise

## 2022-04-19 NOTE — Telephone Encounter (Signed)
PDMP okay, prescription sent 

## 2022-05-25 DIAGNOSIS — L570 Actinic keratosis: Secondary | ICD-10-CM | POA: Diagnosis not present

## 2022-05-25 DIAGNOSIS — L814 Other melanin hyperpigmentation: Secondary | ICD-10-CM | POA: Diagnosis not present

## 2022-05-25 DIAGNOSIS — L821 Other seborrheic keratosis: Secondary | ICD-10-CM | POA: Diagnosis not present

## 2022-05-25 DIAGNOSIS — D225 Melanocytic nevi of trunk: Secondary | ICD-10-CM | POA: Diagnosis not present

## 2022-05-25 DIAGNOSIS — X32XXXA Exposure to sunlight, initial encounter: Secondary | ICD-10-CM | POA: Diagnosis not present

## 2022-05-25 DIAGNOSIS — L818 Other specified disorders of pigmentation: Secondary | ICD-10-CM | POA: Diagnosis not present

## 2022-07-11 ENCOUNTER — Encounter: Payer: Self-pay | Admitting: *Deleted

## 2022-07-12 ENCOUNTER — Telehealth (INDEPENDENT_AMBULATORY_CARE_PROVIDER_SITE_OTHER): Payer: BLUE CROSS/BLUE SHIELD | Admitting: Adult Health

## 2022-07-12 DIAGNOSIS — G4733 Obstructive sleep apnea (adult) (pediatric): Secondary | ICD-10-CM

## 2022-07-12 NOTE — Progress Notes (Signed)
PATIENT: Joe Blanchard DOB: [birth year]  REASON FOR VISIT: follow up HISTORY FROM: patient PRIMARY NEUROLOGIST:   Virtual Visit via Video Note  I connected with Joe Blanchard on 07/12/22 at  2:15 PM EST by a video enabled telemedicine application located remotely at [facility] and verified that I am speaking with the correct person using two identifiers who was located at their own home.   I discussed the limitations of evaluation and management by telemedicine and the availability of in person appointments. The patient expressed understanding and agreed to proceed.   PATIENT: Joe Blanchard DOB: [birth year]  REASON FOR VISIT: follow up HISTORY FROM: patient  HISTORY OF PRESENT ILLNESS: Today 07/12/22:   Joe Blanchard is a 61 year old male with a history of obstructive sleep apnea on CPAP.  He returns today for virtual visit.  His download indicates that he uses machine nightly for compliance of 100%.  On average he uses his machine 9 hours and 2 minutes.  His residual AHI is 1.3 on 6 to 18 cm of water with right pressure at 4 cm of water.  He reports that he would like a travel machine.  Willing to pay out-of-pocket for it.   REVIEW OF SYSTEMS: Out of a complete 14 system review of symptoms, the patient complains only of the following symptoms, and all other reviewed systems are negative.  ALLERGIES: No Known Allergies  HOME MEDICATIONS: Outpatient Medications Prior to Visit  Medication Sig Dispense Refill   acetaminophen (TYLENOL) 500 MG tablet Take 500 mg by mouth every 6 (six) hours as needed for mild pain. Reported on 09/23/2015     ALPRAZolam (XANAX) 0.5 MG tablet TAKE ONE TABLET BY MOUTH AT BEDTIME AS NEEDED 30 tablet 1   amLODipine (NORVASC) 5 MG tablet TAKE ONE TABLET BY MOUTH DAILY IN THE EVENING 90 tablet 0   aspirin EC 81 MG tablet Take 81 mg by mouth daily. Swallow whole.     atorvastatin (LIPITOR) 40 MG tablet Take 1 tablet  (40 mg total) by mouth at bedtime. 90 tablet 3   DULoxetine (CYMBALTA) 30 MG capsule TAKE ONE CAPSULE BY MOUTH ONE TIME DAILY 90 capsule 1   zolpidem (AMBIEN) 10 MG tablet TAKE ONE HALF TO ONE FULL TABLET BY MOUTH AT BEDTIME AS NEEDED 30 tablet 4   No facility-administered medications prior to visit.    PAST MEDICAL HISTORY: Past Medical History:  Diagnosis Date   Anxiety    Arthritis    Colon polyps    Cscope 11-11, next 06-2011   Hypertension    on meds    Hypertriglyceridemia    a. 11/2013 - 240.   Prediabetes 06/24/2011   Sleep apnea    will start CPAP once can get a machine     PAST SURGICAL HISTORY: Past Surgical History:  Procedure Laterality Date   COLONOSCOPY     KNEE SURGERY  12/03/08   scope   POLYPECTOMY     SCALP LACERATION REPAIR  ~09-2013    FAMILY HISTORY: Family History  Problem Relation Age of Onset   Other Mother    Dementia Mother    Colon cancer Father        dx in his 85s   Prostate cancer Father        dx in his 6s?   Other Father        died suddenly @ 63.   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           Social Determinants of Health   Financial Resource Strain: Not on file  Food Insecurity: Not on file  Transportation Needs: Not on file  Physical Activity: Not on file  Stress: Not on file  Social Connections: Not on file  Intimate Partner Violence: Not on file      PHYSICAL EXAM Generalized: Well developed, in no acute distress   Neurological examination  Mentation: Alert oriented to time, place, history taking. Follows all commands speech and language fluent Cranial nerve II-XII:Extraocular movements were full. Facial symmetry noted. Head turning and shoulder shrug  were normal and symmetric.   DIAGNOSTIC DATA (LABS, IMAGING, TESTING) - I reviewed patient records, labs, notes, testing and imaging myself where available.  Lab Results  Component Value Date   WBC 5.4 03/23/2021   HGB 14.6 03/23/2021   HCT 44.1 03/23/2021   MCV 86.7 03/23/2021   PLT 174.0 03/23/2021      Component Value Date/Time   NA 140 08/25/2021 1120   NA 140 06/11/2021 1427   K 4.2 08/25/2021 1120   CL 103 08/25/2021 1120   CO2 28 08/25/2021 1120   GLUCOSE 109 (H) 08/25/2021 1120   GLUCOSE 101 12/16/2008 0000   BUN 15 08/25/2021 1120   BUN 19 06/11/2021 1427   CREATININE 0.89 08/25/2021 1120   CALCIUM 9.7 08/25/2021 1120   PROT 7.2 08/25/2021 1120   PROT 7.3 06/17/2020 1427   ALBUMIN 4.7 06/17/2020 1427   AST 19 12/15/2021 0959   ALT 28 12/15/2021 0959   ALKPHOS 95 06/17/2020 1427   BILITOT 0.5 08/25/2021 1120   BILITOT 0.5 06/17/2020 1427   GFRNONAA 94 06/17/2020 1427   GFRAA 109 06/17/2020 1427   Lab Results  Component Value Date   CHOL 129 12/15/2021   HDL 43 12/15/2021   LDLCALC 63 12/15/2021   LDLDIRECT 143.0 06/04/2018   TRIG 156 (H) 12/15/2021   CHOLHDL 3.0 12/15/2021   Lab Results  Component Value Date   HGBA1C 6.2 (H) 08/25/2021   Lab Results   Component Value Date   VITAMINB12 567 02/03/2016   Lab Results  Component Value Date  TSH 4.39 08/25/2021      ASSESSMENT AND PLAN 61 y.o. year old male  has a past medical history of Anxiety, Arthritis, Colon polyps, Hypertension, Hypertriglyceridemia, Prediabetes (06/24/2011), and Sleep apnea. here with:  OSA on CPAP  CPAP compliance excellent Residual AHI is good Encouraged patient to continue using CPAP nightly and > 4 hours each night Order sent for travel machine F/U in 1 year or sooner if needed    Ward Givens, MSN, NP-C 07/12/2022, 2:10 PM [facility] [address], [place], [place] [address] [phone]

## 2022-07-13 ENCOUNTER — Other Ambulatory Visit: Payer: Self-pay | Admitting: Internal Medicine

## 2022-07-14 NOTE — Progress Notes (Signed)
New, Willodean Rosenthal, RN; Redmond Pulling, Jake Shark Received, Thank you!     Previous Messages    ----- Message ----- From: Brandon Melnick, RN Sent: 07/14/2022   7:55 AM EST To: Darlina Guys; Miquel Dunn; Nash Shearer; * Subject: travel machine pressure 8, paying OOP          Good morning,  New order in [place]. [name], 61 y.o., 1961/06/19 MRN: [id number]   [facility]

## 2022-08-30 ENCOUNTER — Ambulatory Visit (INDEPENDENT_AMBULATORY_CARE_PROVIDER_SITE_OTHER): Payer: BLUE CROSS/BLUE SHIELD | Admitting: Internal Medicine

## 2022-08-30 ENCOUNTER — Encounter: Payer: Self-pay | Admitting: Internal Medicine

## 2022-08-30 VITALS — BP 126/82 | HR 61 | Temp 97.7°F | Resp 18 | Ht 71.0 in | Wt 233.0 lb

## 2022-08-30 DIAGNOSIS — R739 Hyperglycemia, unspecified: Secondary | ICD-10-CM | POA: Diagnosis not present

## 2022-08-30 DIAGNOSIS — Z79899 Other long term (current) drug therapy: Secondary | ICD-10-CM

## 2022-08-30 DIAGNOSIS — E785 Hyperlipidemia, unspecified: Secondary | ICD-10-CM

## 2022-08-30 DIAGNOSIS — Z0000 Encounter for general adult medical examination without abnormal findings: Secondary | ICD-10-CM | POA: Diagnosis not present

## 2022-08-30 DIAGNOSIS — I10 Essential (primary) hypertension: Secondary | ICD-10-CM

## 2022-08-30 DIAGNOSIS — Z23 Encounter for immunization: Secondary | ICD-10-CM | POA: Diagnosis not present

## 2022-08-30 DIAGNOSIS — F419 Anxiety disorder, unspecified: Secondary | ICD-10-CM

## 2022-08-30 NOTE — Patient Instructions (Addendum)
Vaccines I recommend:  Covid booster RSV vaccine  Check the  blood pressure regularly BP GOAL is between 110/65 and  135/85. If it is consistently higher or lower, let me know      GO TO THE LAB : Get the blood work     Blossom, Price back for   a checkup in 6 months    "[place] of attorney" ,  "Living will" (Advance care planning documents)  If you already have a living will or healthcare power of attorney, is recommended you bring the copy to be scanned in your chart.   The document will be available to all the doctors you see in the system.  Advance care planning is a process that supports adults in  understanding and sharing their preferences regarding future medical care.  The patient's preferences are recorded in documents called Advance Directives and the can be modified at any time while the patient is in full mental capacity.   If you don't have one, please consider create one.      More information at: [email or web address]

## 2022-08-30 NOTE — Progress Notes (Signed)
Subjective:    Patient ID: Joe Blanchard, male    DOB: Jul 08, 1961, 62 y.o.   MRN: [id number]  DOS:  08/30/2022 Type of visit - description: CPX  Here for CPX. Having significant pain on the right knee, contemplating surgery. Specifically denies chest pain or difficulty breathing.  No palpitations, no lower extremity edema. No LUTS.  Review of Systems  Other than above, a 14 point review of systems is negative     Past Medical History:  Diagnosis Date   Anxiety    Arthritis    Colon polyps    Cscope 11-11, next 06-2011   Hypertension    on meds    Hypertriglyceridemia    a. 11/2013 - 240.   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           Social Determinants of Health   Financial Resource Strain: Not on file  Food Insecurity: Not on file  Transportation Needs: Not on file  Physical Activity: Not on file  Stress: Not on file  Social Connections: Not on file  Intimate Partner Violence: Not on file    Current Outpatient Medications   Medication Instructions   acetaminophen (TYLENOL) 500 mg, Oral, Every 6 hours PRN, Reported on 09/23/2015   ALPRAZolam (XANAX) 0.5 MG tablet TAKE ONE TABLET BY MOUTH AT BEDTIME AS NEEDED   amLODipine (NORVASC) 5 mg, Oral, Every evening   aspirin EC 81 mg, Oral, Daily, Swallow whole.   atorvastatin (LIPITOR) 40 mg, Oral, Daily at bedtime   DULoxetine (CYMBALTA) 30 mg, Oral, Daily   ibuprofen (ADVIL) 200 mg, Oral, 2 times daily PRN   zolpidem (AMBIEN) 10 MG tablet TAKE ONE HALF TO ONE FULL TABLET BY MOUTH AT BEDTIME AS NEEDED       Objective:   Physical Exam BP 126/82   Pulse 61   Temp 97.7 F (36.5 C) (Oral)   Resp 18   Ht '5\' 11"'$  (1.803 m)   Wt 233 lb (105.7 kg)   SpO2 98%   BMI 32.50 kg/m  General: Well developed, NAD, BMI noted Neck: No  thyromegaly  HEENT:  Normocephalic . Face symmetric, atraumatic Lungs:  CTA B Normal respiratory effort, no intercostal retractions, no accessory muscle use. Heart: RRR,  no murmur.  Abdomen:  Not distended, soft, non-tender. No rebound or rigidity.   Lower extremities: no pretibial edema bilaterally DRE: Normal sphincter tone, no stools.  Prostate gland feels normal today. Skin: Exposed areas without rash. Not pale. Not jaundice Neurologic:  alert & oriented X3.  Speech normal, gait appropriate, mild limping due to knee pain Strength symmetric and appropriate for age.  Psych: Cognition and judgment appear intact.  Cooperative with normal attention span and concentration.  Behavior appropriate. No anxious or depressed appearing.     Assessment     Assessment: Prediabetes HTN: DX 07/2020 Dyslipidemia, high TG DJD: Mostly at the knees Anxiety, insomnia: 2012: Rx citalopram --> switch to prozac 09-2012 d/t fatigue and no improvment, switch to cymbalta 08-2014 w/ great results Insomnia (on ambien and/or xanax prn) Hypogonadism- saw endo 2014, took clomid, T level normalized but pt d/c meds as he did not feel subjectively better   Severe OSA, HST 08/19/2020  CV:  -Stress test (03-2021): EF was 50% with apical hypokinesia, no evidence of ischemia, saw cardiology.  -Coronary calcium score (06-2021): >  75 percentile  -CT coronary morphology with CTA 06-2021.  See results.  Medical management  PLAN Here for CPX Prediabetes: Check A1c, he is planning to lose some weight. HTN: BP is great today,rx ambulatory BPs as needed, continue amlodipine. Dyslipidemia: On atorvastatin 40 mg, check FLP. CAD: On medical management, asymptomatic.  On aspirin. DJD: Severe left knee pain, thinks he will need a knee replacement at some point in the near future.   Recommend to continue Tylenol, okay to take ibuprofen sporadically, with food.   Recently  forgot Cymbalta x 2 days and  DJD symptoms got much worse. Plans to see Ortho. Anxiety insomnia: Hardly ever takes Ambien, on Cymbalta and Xanax as needed.  Symptoms are getting better. RTC 6 months

## 2022-08-31 ENCOUNTER — Other Ambulatory Visit: Payer: Self-pay | Admitting: Internal Medicine

## 2022-08-31 ENCOUNTER — Encounter: Payer: Self-pay | Admitting: Internal Medicine

## 2022-08-31 LAB — COMPREHENSIVE METABOLIC PANEL
AG Ratio: 1.7 (calc) (ref 1.0–2.5)
ALT: 37 U/L (ref 9–46)
AST: 21 U/L (ref 10–35)
Albumin: 4.7 g/dL (ref 3.6–5.1)
Alkaline phosphatase (APISO): 85 U/L (ref 35–144)
BUN: 15 mg/dL (ref 7–25)
CO2: 23 mmol/L (ref 20–32)
Calcium: 10.2 mg/dL (ref 8.6–10.3)
Chloride: 102 mmol/L (ref 98–110)
Creat: 1.02 mg/dL (ref 0.70–1.35)
Globulin: 2.8 g/dL (calc) (ref 1.9–3.7)
Glucose, Bld: 111 mg/dL — ABNORMAL HIGH (ref 65–99)
Potassium: 4.4 mmol/L (ref 3.5–5.3)
Sodium: 139 mmol/L (ref 135–146)
Total Bilirubin: 0.6 mg/dL (ref 0.2–1.2)
Total Protein: 7.5 g/dL (ref 6.1–8.1)

## 2022-08-31 LAB — CBC WITH DIFFERENTIAL/PLATELET
Absolute Monocytes: 325 cells/uL (ref 200–950)
Basophils Absolute: 39 cells/uL (ref 0–200)
Basophils Relative: 0.7 %
Eosinophils Absolute: 110 cells/uL (ref 15–500)
Eosinophils Relative: 2 %
HCT: 44.9 % (ref 38.5–50.0)
Hemoglobin: 15.1 g/dL (ref 13.2–17.1)
Lymphs Abs: 1485 cells/uL (ref 850–3900)
MCH: 28.7 pg (ref 27.0–33.0)
MCHC: 33.6 g/dL (ref 32.0–36.0)
MCV: 85.4 fL (ref 80.0–100.0)
MPV: 10.6 fL (ref 7.5–12.5)
Monocytes Relative: 5.9 %
Neutro Abs: 3542 cells/uL (ref 1500–7800)
Neutrophils Relative %: 64.4 %
Platelets: 207 10*3/uL (ref 140–400)
RBC: 5.26 10*6/uL (ref 4.20–5.80)
RDW: 12.8 % (ref 11.0–15.0)
Total Lymphocyte: 27 %
WBC: 5.5 10*3/uL (ref 3.8–10.8)

## 2022-08-31 LAB — HEMOGLOBIN A1C
Hgb A1c MFr Bld: 6.5 % of total Hgb — ABNORMAL HIGH (ref <5.7)
Mean Plasma Glucose: 140 mg/dL
eAG (mmol/L): 7.7 mmol/L

## 2022-08-31 LAB — PSA: PSA: 1.35 ng/mL (ref <=4.00)

## 2022-08-31 LAB — LIPID PANEL
Cholesterol: 148 mg/dL (ref <200)
HDL: 41 mg/dL (ref >=40)
LDL Cholesterol (Calc): 77 mg/dL (calc)
Non-HDL Cholesterol (Calc): 107 mg/dL (calc) (ref <130)
Total CHOL/HDL Ratio: 3.6 (calc) (ref <5.0)
Triglycerides: 208 mg/dL — ABNORMAL HIGH (ref <150)

## 2022-08-31 NOTE — Assessment & Plan Note (Signed)
Here for CPX Prediabetes: Check A1c, he is planning to lose some weight. HTN: BP is great today,rx ambulatory BPs as needed, continue amlodipine. Dyslipidemia: On atorvastatin 40 mg, check FLP. CAD: On medical management, asymptomatic.  On aspirin. DJD: Severe left knee pain, thinks he will need a knee replacement at some point in the near future.   Recommend to continue Tylenol, okay to take ibuprofen sporadically, with food.   Recently  forgot Cymbalta x 2 days and  DJD symptoms got much worse. Plans to see Ortho. Anxiety insomnia: Hardly ever takes Ambien, on Cymbalta and Xanax as needed.  Symptoms are getting better. RTC 6 months

## 2022-08-31 NOTE — Assessment & Plan Note (Signed)
-  Tdap 2023 - had shingrix x 2 - RSV- covid vax: rec  bivalent  booster  - Flu shot today - (+) FH of prostate cancer no symptoms, DRE today normal, check PSA. - (+)  FH colon cancer, Cscope 06-2010,  08-2011, 06-2015 >> neg.  Last colonoscopy 09/24/2020.  Next 2027 per GI letter   - Labs:  CMP FLP CBC A1c PSA --Diet exercise: Discussed, planning to get serious with diet and lose weight. - Healthcare POA: See AVS

## 2022-09-01 LAB — DM TEMPLATE

## 2022-09-01 LAB — DRUG MONITORING PANEL 375977 , URINE

## 2022-09-05 ENCOUNTER — Encounter: Payer: Self-pay | Admitting: Internal Medicine

## 2022-09-19 ENCOUNTER — Telehealth: Payer: Self-pay | Admitting: Internal Medicine

## 2022-09-19 NOTE — Telephone Encounter (Signed)
Requesting: Ambien '10mg'$   Contract: 08/30/22 UDS: 08/30/22 Last Visit: 08/30/22 Next Visit: 02/28/23 Last Refill: 03/21/22 #30 and 4RF  Please Advise

## 2022-09-19 NOTE — Telephone Encounter (Signed)
PDMP okay, Rx sent 

## 2022-09-21 ENCOUNTER — Encounter: Payer: Self-pay | Admitting: Internal Medicine

## 2022-09-21 DIAGNOSIS — M25562 Pain in left knee: Secondary | ICD-10-CM | POA: Diagnosis not present

## 2022-09-21 DIAGNOSIS — M25561 Pain in right knee: Secondary | ICD-10-CM | POA: Diagnosis not present

## 2022-09-23 ENCOUNTER — Ambulatory Visit: Payer: BLUE CROSS/BLUE SHIELD | Admitting: Internal Medicine

## 2022-10-13 ENCOUNTER — Encounter: Payer: Self-pay | Admitting: Internal Medicine

## 2022-10-13 DIAGNOSIS — G4733 Obstructive sleep apnea (adult) (pediatric): Secondary | ICD-10-CM | POA: Diagnosis not present

## 2022-10-13 MED ORDER — AMLODIPINE BESYLATE 2.5 MG PO TABS: 2.5000 mg | ORAL_TABLET | Freq: Every day | ORAL | 1 refills | Status: DC

## 2022-10-27 DIAGNOSIS — M170 Bilateral primary osteoarthritis of knee: Secondary | ICD-10-CM | POA: Diagnosis not present

## 2022-10-27 DIAGNOSIS — M25562 Pain in left knee: Secondary | ICD-10-CM | POA: Diagnosis not present

## 2022-11-11 DIAGNOSIS — G4733 Obstructive sleep apnea (adult) (pediatric): Secondary | ICD-10-CM | POA: Diagnosis not present

## 2022-11-16 ENCOUNTER — Encounter: Payer: Self-pay | Admitting: Internal Medicine

## 2022-12-12 DIAGNOSIS — G4733 Obstructive sleep apnea (adult) (pediatric): Secondary | ICD-10-CM | POA: Diagnosis not present

## 2022-12-14 HISTORY — PX: TOTAL KNEE ARTHROPLASTY: SHX125

## 2022-12-19 ENCOUNTER — Ambulatory Visit: Payer: BLUE CROSS/BLUE SHIELD | Admitting: Internal Medicine

## 2022-12-21 ENCOUNTER — Ambulatory Visit (INDEPENDENT_AMBULATORY_CARE_PROVIDER_SITE_OTHER): Payer: BLUE CROSS/BLUE SHIELD | Admitting: Internal Medicine

## 2022-12-21 VITALS — BP 120/76 | HR 53 | Temp 98.5°F | Resp 12 | Ht 71.0 in | Wt 187.2 lb

## 2022-12-21 DIAGNOSIS — I2510 Atherosclerotic heart disease of native coronary artery without angina pectoris: Secondary | ICD-10-CM | POA: Diagnosis not present

## 2022-12-21 DIAGNOSIS — I2583 Coronary atherosclerosis due to lipid rich plaque: Secondary | ICD-10-CM

## 2022-12-21 DIAGNOSIS — R739 Hyperglycemia, unspecified: Secondary | ICD-10-CM

## 2022-12-21 DIAGNOSIS — Z01818 Encounter for other preprocedural examination: Secondary | ICD-10-CM | POA: Diagnosis not present

## 2022-12-21 DIAGNOSIS — M1991 Primary osteoarthritis, unspecified site: Secondary | ICD-10-CM

## 2022-12-21 NOTE — Patient Instructions (Signed)
°  GO TO THE LAB : Get the blood work   ° ° °GO TO THE FRONT DESK, PLEASE SCHEDULE YOUR APPOINTMENTS °Come back for a checkup in 3 months °

## 2022-12-21 NOTE — Progress Notes (Signed)
Subjective:    Patient ID: Joe Blanchard, male    DOB: 1961-08-08, 62 y.o.   MRN: [id number]  DOS:  12/21/2022 Type of visit - description: Preop eval.  Since the last office visit, he is doing great with diet. Has change completely how he eats, + weight loss around 40 pounds.  ambulatory BPs were dropping, we agreed to stop amlodipine and  ambulatory BPs remain very good.  He needs surgery, denies chest pain or difficulty breathing.  No palpitations.  The only limiting factor for going up and down stairs or other exertion is knee pain.  He does heavy yard work without any problems. No nausea vomiting.  No blood in the stools.  Wt Readings from Last 3 Encounters:  12/21/22 187 lb 3.2 oz (84.9 kg)  08/30/22 233 lb (105.7 kg)  08/25/21 231 lb 8 oz (105 kg)     Review of Systems See above   Past Medical History:  Diagnosis Date   Anxiety    Arthritis    Colon polyps    Cscope 11-11, next 06-2011   Hypertension    on meds    Hypertriglyceridemia    a. 11/2013 - 240.   Prediabetes 06/24/2011   Sleep apnea    will start CPAP once can get a machine     Past Surgical History:  Procedure Laterality Date   COLONOSCOPY     KNEE SURGERY  12/03/08   scope   POLYPECTOMY     SCALP LACERATION REPAIR  ~09-2013    Current Outpatient Medications  Medication Instructions   acetaminophen (TYLENOL) 500 mg, Oral, Every 6 hours PRN, Reported on 09/23/2015   amLODipine (NORVASC) 2.5 mg, Oral, Daily   aspirin EC 81 mg, Oral, Daily, Swallow whole.   atorvastatin (LIPITOR) 40 mg, Oral, Daily at bedtime   DULoxetine (CYMBALTA) 30 mg, Oral, Daily   ibuprofen (ADVIL) 200 mg, Oral, 2 times daily PRN   zolpidem (AMBIEN) 5 mg, Oral, At bedtime PRN       Objective:   Physical Exam BP 120/76 (BP Location: Right Arm, Cuff Size: Normal)   Pulse (!) 53   Temp 98.5 F (36.9 C) (Oral)   Resp 12   Ht 5\' 11"  (1.803 m)   Wt 187 lb 3.2 oz (84.9 kg)   SpO2 96%   BMI 26.11 kg/m  General:    Well developed, NAD, BMI noted.  HEENT:  Normocephalic . Face symmetric, atraumatic Neck: No JVD at 45 degrees Lungs:  CTA B Normal respiratory effort, no intercostal retractions, no accessory muscle use. Heart: RRR,  no murmur.  Abdomen:  Not distended, soft, non-tender. No rebound or rigidity.   Skin: Not pale. Not jaundice Lower extremities: no pretibial edema bilaterally  Neurologic:  alert & oriented X3.  Speech normal, gait appropriate for age and unassisted Psych--  Cognition and judgment appear intact.  Cooperative with normal attention span and concentration.  Behavior appropriate. No anxious or depressed appearing.     Assessment     Assessment: Prediabetes HTN: DX 07/2020 Dyslipidemia, high TG DJD: Mostly at the knees Anxiety, insomnia: 2012: Rx citalopram --> switch to prozac 09-2012 d/t fatigue and no improvment, switch to cymbalta 08-2014 w/ great results Insomnia (on ambien and/or xanax prn) Hypogonadism- saw endo 2014, took clomid, T level normalized but pt d/c meds as he did not feel subjectively better  Severe OSA, HST 08/19/2020  CV:  -Stress test (03-2021): EF was 50% with apical hypokinesia, no evidence of  ischemia, saw cardiology.  -Coronary calcium score (06-2021): >  75 percentile  -CT coronary morphology with CTA 06-2021.  See results.  Medical management  PLAN CAD: Per coronary CTA, mild plaque in 2  coronary arteries, medical management.  Currently on aspirin and atorvastatin, doing great with lifestyle, has lost 40 pounds in the last few months. Get BMP, CBC HTN: After he lost weight, we agreed to stop amlodipine, ambulatory BPs currently 105/69, 96/60. OSA: Good CPAP compliance. Hyperglycemia: Check A1c DJD, needs a L TKR. Doing well from the cardiac standpoint with no cardiopulmonary symptoms. EKG today, sinus bradycardia, no acute changes, unchanged from previous. Okay to proceed with surgery, okay to hold aspirin 5 to 7 days and restart as  soon as ok by surgery. Hyperlipidemia: On atorvastatin, stop statins?  Recommend not to, perhaps we could decrease the dose depending on results.  Reassess when he comes back.   RTC 3 m

## 2022-12-21 NOTE — Assessment & Plan Note (Signed)
CAD: Per coronary CTA, mild plaque in 2  coronary arteries, medical management.  Currently on aspirin and atorvastatin, doing great with lifestyle, has lost 40 pounds in the last few months. Get BMP, CBC HTN: After he lost weight, we agreed to stop amlodipine, ambulatory BPs currently 105/69, 96/60. OSA: Good CPAP compliance. Hyperglycemia: Check A1c DJD, needs a L TKR. Doing well from the cardiac standpoint with no cardiopulmonary symptoms. EKG today, sinus bradycardia, no acute changes, unchanged from previous. Okay to proceed with surgery, okay to hold aspirin 5 to 7 days and restart as soon as ok by surgery. Hyperlipidemia: On atorvastatin, stop statins?  Recommend not to, perhaps we could decrease the dose depending on results.  Reassess when he comes back.   RTC 3 m

## 2022-12-22 ENCOUNTER — Telehealth: Payer: Self-pay

## 2022-12-22 LAB — CBC WITH DIFFERENTIAL/PLATELET
Absolute Monocytes: 336 cells/uL (ref 200–950)
Basophils Absolute: 22 cells/uL (ref 0–200)
Basophils Relative: 0.4 %
Eosinophils Absolute: 50 cells/uL (ref 15–500)
Eosinophils Relative: 0.9 %
HCT: 43.4 % (ref 38.5–50.0)
Hemoglobin: 14.1 g/dL (ref 13.2–17.1)
Lymphs Abs: 1238 cells/uL (ref 850–3900)
MCH: 28.4 pg (ref 27.0–33.0)
MCHC: 32.5 g/dL (ref 32.0–36.0)
MCV: 87.5 fL (ref 80.0–100.0)
MPV: 11.4 fL (ref 7.5–12.5)
Monocytes Relative: 6 %
Neutro Abs: 3954 cells/uL (ref 1500–7800)
Neutrophils Relative %: 70.6 %
Platelets: 180 10*3/uL (ref 140–400)
RBC: 4.96 10*6/uL (ref 4.20–5.80)
RDW: 13.1 % (ref 11.0–15.0)
Total Lymphocyte: 22.1 %
WBC: 5.6 10*3/uL (ref 3.8–10.8)

## 2022-12-22 LAB — BASIC METABOLIC PANEL
BUN: 15 mg/dL (ref 7–25)
CO2: 26 mmol/L (ref 20–32)
Calcium: 10 mg/dL (ref 8.6–10.3)
Chloride: 103 mmol/L (ref 98–110)
Creat: 0.92 mg/dL (ref 0.70–1.35)
Glucose, Bld: 84 mg/dL (ref 65–99)
Potassium: 4.1 mmol/L (ref 3.5–5.3)
Sodium: 140 mmol/L (ref 135–146)

## 2022-12-22 LAB — HEMOGLOBIN A1C
Hgb A1c MFr Bld: 5.6 % of total Hgb (ref <5.7)
Mean Plasma Glucose: 114 mg/dL
eAG (mmol/L): 6.3 mmol/L

## 2022-12-22 NOTE — Telephone Encounter (Signed)
Surgery clearance form completed and faxed back to Attn: Aida Raider at [phone] ([facility])- Pt needing L TKA. Form sent for scanning.

## 2022-12-22 NOTE — Telephone Encounter (Signed)
Received fax confirmation

## 2022-12-26 ENCOUNTER — Ambulatory Visit: Payer: BLUE CROSS/BLUE SHIELD | Admitting: Internal Medicine

## 2023-01-03 DIAGNOSIS — M1712 Unilateral primary osteoarthritis, left knee: Secondary | ICD-10-CM | POA: Diagnosis not present

## 2023-01-11 ENCOUNTER — Encounter: Payer: Self-pay | Admitting: Internal Medicine

## 2023-01-11 ENCOUNTER — Telehealth (INDEPENDENT_AMBULATORY_CARE_PROVIDER_SITE_OTHER): Payer: BLUE CROSS/BLUE SHIELD | Admitting: Internal Medicine

## 2023-01-11 VITALS — BP 104/74 | HR 113 | Wt 187.0 lb

## 2023-01-11 DIAGNOSIS — R21 Rash and other nonspecific skin eruption: Secondary | ICD-10-CM

## 2023-01-11 DIAGNOSIS — G4733 Obstructive sleep apnea (adult) (pediatric): Secondary | ICD-10-CM | POA: Diagnosis not present

## 2023-01-11 MED ORDER — PREDNISONE 10 MG PO TABS: ORAL_TABLET | ORAL | 0 refills | Status: DC

## 2023-01-11 MED ORDER — HYDROXYZINE HCL 10 MG PO TABS: 10.0000 mg | ORAL_TABLET | Freq: Four times a day (QID) | ORAL | 0 refills | Status: DC | PRN

## 2023-01-11 NOTE — Progress Notes (Unsigned)
Subjective:    Patient ID: Joe Blanchard, male    DOB: [birth year], 62 y.o.   MRN: [id number]  DOS:  01/11/2023 Type of visit - description: Virtual Visit via Video Note  I connected with the above patient  by a video enabled telemedicine application and verified that I am speaking with the correct person using two identifiers.   Location of patient: home  Location of provider: office  Persons participating in the virtual visit: patient, provider   I discussed the limitations of evaluation and management by telemedicine and the availability of in person appointments. The patient expressed understanding and agreed to proceed.  Acute The patient had left TKR 01/03/2023. Developed an itchy rash predominantly at the back and some of the anterior and lateral chest. He is not sure what is the primary lesion, perhaps small blister. No rash on the face, neck, arms or legs. No fever or chills No lip or tongue swelling No generalized itching, only itchy where the rash is.  Has with numerous medications since he had surgery.    I discussed the assessment and treatment plan with the patient. The patient was provided an opportunity to ask questions and all were answered. The patient agreed with the plan and demonstrated an understanding of the instructions.   The patient was advised to call back or seek an in-person evaluation if the symptoms worsen or if the condition fails to improve as anticipated.   NO NEED FOR TIME ID DOCUMENTATION SUPPORTS A LEVEL III OR IV OK TO USE TEMPLATE AS BELOW IF NEEDED   Today, I spent more than    min with the patient: >50% of the time counseling regards  Also: -reviewing the chart and labs ordered by other providers  -coordinating his care      Review of Systems See above   Past Medical History:  Diagnosis Date   Anxiety    Arthritis    Colon polyps    Cscope 11-11, next 06-2011   Hypertension    on meds    Hypertriglyceridemia    a.  11/2013 - 240.   Prediabetes 06/24/2011   Sleep apnea    will start CPAP once can get a machine     Past Surgical History:  Procedure Laterality Date   COLONOSCOPY     KNEE SURGERY  12/03/08   scope   POLYPECTOMY     SCALP LACERATION REPAIR  ~09-2013    Current Outpatient Medications  Medication Instructions   acetaminophen (TYLENOL) 500 mg, Oral, Every 6 hours PRN, Reported on 09/23/2015   aspirin EC 81 mg, Oral, Daily, Swallow whole.   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  In no distress, speaking in complete sentences. The distribution of the right rashes on the back, some on the anterior chest between the breast and some of the lateral sides of the chest. See picture.     Assessment     Assessment: Prediabetes HTN: DX 07/2020 Dyslipidemia, high TG DJD: Mostly at  the knees Anxiety, insomnia: 2012: Rx citalopram --> switch to prozac 09-2012 d/t fatigue and no improvment, switch to cymbalta 08-2014 w/ great results Insomnia (on ambien and/or xanax prn) Hypogonadism- saw endo 2014, took clomid, T level normalized but pt d/c meds as he did not feel subjectively better  Severe OSA, HST 08/19/2020  CV:  -Stress test (03-2021): EF was 50% with apical hypokinesia, no evidence of ischemia, saw cardiology.  -Coronary calcium score (06-2021): >  75 percentile  -CT coronary morphology with CTA 06-2021.  See results.  Medical management  PLAN Rash: Pruritic rash located at the mostly back, with no systemic symptoms in the context of her recent total knee replacement and the introduction of  oxycodone-gabapentin-methocarbamol-Zofran-Colace-MiraLAX. The rash will be somewhat atypical for drug-related reaction.  Seems to be localized problem, the patient admits he has been sleeping on his back since the surgery and the area has been very sweaty. Will treat the symptoms based on the information available: Switch Benadryl to Atarax, prescription sent Round of a very low-dose of prednisone Aveeno Soon, his orthopedist is going to switch him from oxycodone to Dilaudid.  Will have to see what happened after that. Come back to the office in 1 week if not better, seek medical attention if severe symptoms. Message with clear instructions sent.    It was good to see you today and I hope you feel better soon. Stop Benadryl and take Atarax (hydroxyzine) as as needed for itching.  I sent a prescription. Take prednisone as prescribed, it is a low-dose After you take a shower and pat the skin dry, apply Aveeno over-the-counter.  If that helps, you can use it a couple times a day. Please come back to the office in person if not gradually better. Seek medical attention if severe symptoms, fever, chills, shortness of breath.

## 2023-01-12 NOTE — Assessment & Plan Note (Signed)
Rash: Pruritic rash located at the mostly back, with no systemic symptoms in the context of her recent total knee replacement and the introduction of oxycodone-gabapentin-methocarbamol-Zofran-Colace-MiraLAX. The rash is somewhat atypical for drug-related reaction.  Seems to be localized problem, the patient admits he has been sleeping on his back since knee surgery 01-03-23 and the area has been very sweaty. Plan  Switch Benadryl to Atarax, prescription sent Round of a very low-dose of prednisone Aveeno Soon, his orthopedist is going to switch him from oxycodone to Dilaudid.  Will have to see what happened after that. Come back to the office in 1 week if not better, seek medical attention if severe symptoms. Message with clear instructions sent.

## 2023-01-16 ENCOUNTER — Ambulatory Visit: Admit: 2023-01-16 | Payer: BLUE CROSS/BLUE SHIELD | Admitting: Orthopedic Surgery

## 2023-01-16 SURGERY — ARTHROPLASTY, KNEE, TOTAL
Anesthesia: Choice | Site: Knee | Laterality: Left

## 2023-01-17 ENCOUNTER — Other Ambulatory Visit: Payer: Self-pay | Admitting: Internal Medicine

## 2023-02-11 DIAGNOSIS — G4733 Obstructive sleep apnea (adult) (pediatric): Secondary | ICD-10-CM | POA: Diagnosis not present

## 2023-02-21 ENCOUNTER — Encounter: Payer: Self-pay | Admitting: Adult Health

## 2023-02-27 ENCOUNTER — Encounter: Payer: Self-pay | Admitting: Internal Medicine

## 2023-02-28 ENCOUNTER — Encounter: Payer: Self-pay | Admitting: Internal Medicine

## 2023-02-28 ENCOUNTER — Ambulatory Visit: Payer: BLUE CROSS/BLUE SHIELD | Admitting: Internal Medicine

## 2023-02-28 VITALS — BP 126/78 | HR 56 | Temp 98.1°F | Resp 16 | Ht 71.0 in | Wt 184.0 lb

## 2023-02-28 DIAGNOSIS — M545 Low back pain, unspecified: Secondary | ICD-10-CM

## 2023-02-28 DIAGNOSIS — I10 Essential (primary) hypertension: Secondary | ICD-10-CM

## 2023-02-28 DIAGNOSIS — E785 Hyperlipidemia, unspecified: Secondary | ICD-10-CM | POA: Diagnosis not present

## 2023-02-28 DIAGNOSIS — R61 Generalized hyperhidrosis: Secondary | ICD-10-CM

## 2023-02-28 LAB — CBC WITH DIFFERENTIAL/PLATELET
Basophils Absolute: 28 cells/uL (ref 0–200)
HCT: 43.2 % (ref 38.5–50.0)
MCH: 28 pg (ref 27.0–33.0)
MCV: 86.9 fL (ref 80.0–100.0)
Neutro Abs: 3017 cells/uL (ref 1500–7800)
RDW: 12.9 % (ref 11.0–15.0)

## 2023-02-28 NOTE — Patient Instructions (Addendum)
Vaccines I recommend: RSV vaccine Flu shot this fall  Come back January for your physical exam but sooner if you have any questions or concerns  Check the  blood pressure regularly BP GOAL is between 110/65 and  135/85. If it is consistently higher or lower, let me know     GO TO THE LAB : Get the blood work     GO TO THE FRONT DESK, PLEASE SCHEDULE YOUR APPOINTMENTS Come back for   a physical exam by 08-2023

## 2023-02-28 NOTE — Progress Notes (Unsigned)
Subjective:    Patient ID: Joe Blanchard, male    DOB: 1961/01/25, 62 y.o.   MRN: [id number]  DOS:  02/28/2023 Type of visit - description: Follow-up  Routine follow-up.  DJD: Recuperating from recent left knee replacement.  Still having issues with difficulty sleeping, since he had the knee surgery has developed pain located at the left SI left buttock area that is difficulty his sleep.  Was recently seen with a rash, it quickly resolved.  Also complains of night sweats since he had surgery. No fever or chills.  No cough.  No nausea vomiting.   He lives with early this year in preparation for surgery but it was on purpose. His left knee surgery site is progressing well.    Wt Readings from Last 3 Encounters:  02/28/23 184 lb (83.5 kg)  01/11/23 187 lb (84.8 kg)  12/21/22 187 lb 3.2 oz (84.9 kg)     Review of Systems See above   Past Medical History:  Diagnosis Date   Anxiety    Arthritis    Colon polyps    Cscope 11-11, next 06-2011   Hypertension    on meds    Hypertriglyceridemia    a. 11/2013 - 240.   Prediabetes 06/24/2011   Sleep apnea    will start CPAP once can get a machine     Past Surgical History:  Procedure Laterality Date   COLONOSCOPY     KNEE SURGERY  12/03/08   scope   POLYPECTOMY     SCALP LACERATION REPAIR  ~09-2013    Current Outpatient Medications  Medication Instructions   acetaminophen (TYLENOL) 500 mg, Oral, Every 6 hours PRN, Reported on 09/23/2015   aspirin EC 81 mg, Oral, Daily, Swallow whole.   atorvastatin (LIPITOR) 40 mg, Oral, Daily at bedtime   DULoxetine (CYMBALTA) 30 mg, Oral, Daily   hydrOXYzine (ATARAX) 10-20 mg, Oral, Every 6 hours PRN   ibuprofen (ADVIL) 200 mg, Oral, 2 times daily PRN   predniSONE (DELTASONE) 10 MG tablet 3 tabs x 2 days, 2 tabs x 2 days, 1 tab x 2 days   zolpidem (AMBIEN) 5 mg, Oral, At bedtime PRN       Objective:   Physical Exam BP 126/78   Pulse (!) 56   Temp 98.1 F (36.7 C) (Oral)    Resp 16   Ht 5\' 11"  (1.803 m)   Wt 184 lb (83.5 kg)   SpO2 97%   BMI 25.66 kg/m  General:   Well developed, NAD, BMI noted.  HEENT:  Normocephalic . Face symmetric, atraumatic Lymphatic system: No pathologic lymph nodes in the neck, supraclavicular area, underarms or groins Lungs:  CTA B Normal respiratory effort, no intercostal retractions, no accessory muscle use. Heart: RRR,  no murmur.  Abdomen:  Not distended, soft, non-tender. No rebound or rigidity.  No organomegaly Skin: Not pale. Not jaundice Lower extremities: no pretibial edema bilaterally  Neurologic:  alert & oriented X3.  Speech normal, gait appropriate for age and unassisted Psych--  Cognition and judgment appear intact.  Cooperative with normal attention span and concentration.  Behavior appropriate. No anxious or depressed appearing.     Assessment     Assessment: Prediabetes HTN: DX 07/2020 Dyslipidemia, high TG DJD  Anxiety, insomnia: 2012: Rx citalopram --> switch to prozac 09-2012 d/t fatigue and no improvment, switch to cymbalta 08-2014 w/ great results Insomnia (on ambien and/or xanax prn) Hypogonadism- saw endo 2014, took clomid, T level normalized but pt d/c  meds as he did not feel subjectively better  Severe OSA, HST 08/19/2020 , ~ 100% compliance w/ cpap CV:  -Stress test (03-2021): EF was 50% with apical hypokinesia, no evidence of ischemia, saw cardiology.  -Coronary calcium score (06-2021): >  75 percentile  -CT coronary morphology with CTA 06-2021.  See results.  Medical management  PLAN Rash: See LOV: Quickly resolved HTN: BP is very good, reports similar ambulatory BPs, lifestyle controlled. Dyslipidemia: On atorvastatin, LDL goal 70, last LDL above goal.  Check FLP. Back pain: Since he had left TKR has developed pain located at the left SI/buttock area.  Etiology not completely clear, no fever, recommend to discuss with Ortho, has a follow-up next week Night sweats: Without fever, +  weight loss due to better lifestyle.  No lymphadenopathies.  Will check a CBC with blood smear.  If lab is satisfactory, she will keep me posted, if symptoms continue would proceed with further evaluation. RTC 08-2023 CPX 5==== Rash: Pruritic rash located at the mostly back, with no systemic symptoms in the context of her recent total knee replacement and the introduction of oxycodone-gabapentin-methocarbamol-Zofran-Colace-MiraLAX. The rash is somewhat atypical for drug-related reaction.  Seems to be localized problem, the patient admits he has been sleeping on his back since knee surgery 01-03-23 and the area has been very sweaty. Plan  Switch Benadryl to Atarax, prescription sent Round of a very low-dose of prednisone Aveeno Soon, his orthopedist is going to switch him from oxycodone to Dilaudid.  Will have to see what happened after that. Come back to the office in 1 week if not better, seek medical attention if severe symptoms. Message with clear instructions sent.

## 2023-03-01 ENCOUNTER — Other Ambulatory Visit: Payer: Self-pay | Admitting: Internal Medicine

## 2023-03-01 LAB — COMPREHENSIVE METABOLIC PANEL
AG Ratio: 1.8 (calc) (ref 1.0–2.5)
ALT: 20 U/L (ref 9–46)
AST: 16 U/L (ref 10–35)
Albumin: 4.8 g/dL (ref 3.6–5.1)
Alkaline phosphatase (APISO): 80 U/L (ref 35–144)
BUN: 16 mg/dL (ref 7–25)
CO2: 28 mmol/L (ref 20–32)
Calcium: 9.7 mg/dL (ref 8.6–10.3)
Chloride: 103 mmol/L (ref 98–110)
Creat: 0.79 mg/dL (ref 0.70–1.35)
Globulin: 2.6 g/dL (calc) (ref 1.9–3.7)
Glucose, Bld: 97 mg/dL (ref 65–99)
Potassium: 4.4 mmol/L (ref 3.5–5.3)
Sodium: 140 mmol/L (ref 135–146)
Total Bilirubin: 0.6 mg/dL (ref 0.2–1.2)
Total Protein: 7.4 g/dL (ref 6.1–8.1)

## 2023-03-01 LAB — CBC WITH DIFFERENTIAL/PLATELET
Absolute Monocytes: 310 cells/uL (ref 200–950)
Basophils Relative: 0.6 %
Eosinophils Absolute: 80 cells/uL (ref 15–500)
Eosinophils Relative: 1.7 %
Hemoglobin: 13.9 g/dL (ref 13.2–17.1)
Lymphs Abs: 1264 cells/uL (ref 850–3900)
MCHC: 32.2 g/dL (ref 32.0–36.0)
MPV: 10.4 fL (ref 7.5–12.5)
Monocytes Relative: 6.6 %
Neutrophils Relative %: 64.2 %
Platelets: 203 10*3/uL (ref 140–400)
RBC: 4.97 10*6/uL (ref 4.20–5.80)
Total Lymphocyte: 26.9 %
WBC: 4.7 10*3/uL (ref 3.8–10.8)

## 2023-03-01 LAB — LIPID PANEL
Cholesterol: 132 mg/dL (ref <200)
HDL: 45 mg/dL (ref >=40)
LDL Cholesterol (Calc): 69 mg/dL (calc)
Non-HDL Cholesterol (Calc): 87 mg/dL (calc) (ref <130)
Total CHOL/HDL Ratio: 2.9 (calc) (ref <5.0)
Triglycerides: 98 mg/dL (ref <150)

## 2023-03-01 LAB — PATHOLOGIST SMEAR REVIEW

## 2023-03-01 NOTE — Assessment & Plan Note (Signed)
Rash: See LOV: Quickly resolved HTN: BP is very good, reports similar ambulatory BPs, lifestyle controlled. Dyslipidemia: On atorvastatin, LDL goal 70, last LDL above goal.  Check FLP. Back pain: Since he had left TKR has developed pain located at the left SI/buttock area.  Etiology not completely clear, no fever, recommend to discuss with Ortho, has a follow-up next week Night sweats: new issue. Without fever, + weight loss due to better lifestyle.  No lymphadenopathies.  Will check a CBC with blood smear.  If lab is satisfactory, she will keep me posted, if symptoms continue would proceed with further evaluation. RTC 08-2023 CPX

## 2023-03-08 DIAGNOSIS — Z0189 Encounter for other specified special examinations: Secondary | ICD-10-CM | POA: Diagnosis not present

## 2023-03-13 DIAGNOSIS — G4733 Obstructive sleep apnea (adult) (pediatric): Secondary | ICD-10-CM | POA: Diagnosis not present

## 2023-03-16 HISTORY — PX: TOTAL KNEE ARTHROPLASTY: SHX125

## 2023-03-24 ENCOUNTER — Ambulatory Visit: Payer: BLUE CROSS/BLUE SHIELD | Admitting: Internal Medicine

## 2023-04-04 DIAGNOSIS — M1711 Unilateral primary osteoarthritis, right knee: Secondary | ICD-10-CM | POA: Diagnosis not present

## 2023-04-04 DIAGNOSIS — M25761 Osteophyte, right knee: Secondary | ICD-10-CM | POA: Diagnosis not present

## 2023-04-11 DIAGNOSIS — G4733 Obstructive sleep apnea (adult) (pediatric): Secondary | ICD-10-CM | POA: Diagnosis not present

## 2023-04-13 ENCOUNTER — Telehealth: Payer: Self-pay | Admitting: Internal Medicine

## 2023-04-13 DIAGNOSIS — G4733 Obstructive sleep apnea (adult) (pediatric): Secondary | ICD-10-CM | POA: Diagnosis not present

## 2023-04-13 NOTE — Telephone Encounter (Signed)
PDMP okay, Rx sent 

## 2023-04-13 NOTE — Telephone Encounter (Signed)
Requesting: Ambien 10mg   Contract:09/07/22 UDS: 08/30/22 Last Visit: 02/28/23 Next Visit:  09/01/23 Last Refill: 09/19/22 #30 and 3RF   Please Advise

## 2023-04-14 DIAGNOSIS — G4733 Obstructive sleep apnea (adult) (pediatric): Secondary | ICD-10-CM | POA: Diagnosis not present

## 2023-05-12 DIAGNOSIS — G4733 Obstructive sleep apnea (adult) (pediatric): Secondary | ICD-10-CM | POA: Diagnosis not present

## 2023-06-11 DIAGNOSIS — G4733 Obstructive sleep apnea (adult) (pediatric): Secondary | ICD-10-CM | POA: Diagnosis not present

## 2023-07-03 ENCOUNTER — Telehealth: Payer: Self-pay | Admitting: Adult Health

## 2023-07-03 NOTE — Telephone Encounter (Signed)
Pt called wanting to know if his Cpap information is showing on the website due to his appt being a VV. Please advise.

## 2023-07-03 NOTE — Telephone Encounter (Signed)
Pt's data is not updated on the [employer]. He will need to scan the QR + code for [place] to get updated information.  Called the pt to advise him on how to do that. There was no answer. LVM advising that the last data posted was a year ago. Advised the patient that he should be able to upload data by using the app and scanning QR+ code on the [facility] machine. If he can do that, typically it will update and show the new data within few minutes, instructed the patient to call back and I can walk through how to do that, or we can change the appt to in office of he can bring it by here prior to the apt and we can do the download ahead of time.

## 2023-07-03 NOTE — Telephone Encounter (Signed)
Spoke to pt rescheduled VV 07/18/2023 Pt states will go to DME this week and they will fax the current DL report for vv in December

## 2023-07-04 ENCOUNTER — Telehealth: Payer: BLUE CROSS/BLUE SHIELD | Admitting: Adult Health

## 2023-07-12 ENCOUNTER — Encounter: Payer: Self-pay | Admitting: Anesthesiology

## 2023-07-14 DIAGNOSIS — G4733 Obstructive sleep apnea (adult) (pediatric): Secondary | ICD-10-CM | POA: Diagnosis not present

## 2023-07-17 ENCOUNTER — Telehealth: Payer: Self-pay | Admitting: Adult Health

## 2023-07-17 NOTE — Telephone Encounter (Signed)
Pt called and LVM stating that he had dropped off his SD card at [employer] and is wanting to know if they have faxed over the data from the SD card from his Cpap machine. Please advise.

## 2023-07-17 NOTE — Telephone Encounter (Signed)
Printed [employer] 06-06-2023 thru 07-07-2023, most recent on line.  (This would be from him home machine).

## 2023-07-17 NOTE — Telephone Encounter (Signed)
I called pt and he has DL, is compliant from a 30 day.  Is using machine.  I will call the DME tomorrow, as he states they were send to [place].  He has appt 0930.  He received the ESS to do as well.

## 2023-07-17 NOTE — Telephone Encounter (Signed)
Pt is asking for a call from [place], [place]

## 2023-07-17 NOTE — Telephone Encounter (Signed)
I LVM for pt this is for his yearly check up.  The lastest info that I have is 30 day DL from 93/2355 thru [id number].  Printed.  We did not need from travel cpap.  He is to call back if questions.

## 2023-07-18 ENCOUNTER — Telehealth (INDEPENDENT_AMBULATORY_CARE_PROVIDER_SITE_OTHER): Payer: BLUE CROSS/BLUE SHIELD | Admitting: Adult Health

## 2023-07-18 DIAGNOSIS — G4733 Obstructive sleep apnea (adult) (pediatric): Secondary | ICD-10-CM | POA: Diagnosis not present

## 2023-07-18 NOTE — Telephone Encounter (Signed)
I called [employer] [phone] spoke to [place].  There last DL date is [id number].  Pt has luna 2 machine which requires a card DL.

## 2023-07-19 DIAGNOSIS — G4733 Obstructive sleep apnea (adult) (pediatric): Secondary | ICD-10-CM | POA: Diagnosis not present

## 2023-07-27 DIAGNOSIS — L578 Other skin changes due to chronic exposure to nonionizing radiation: Secondary | ICD-10-CM | POA: Diagnosis not present

## 2023-07-27 DIAGNOSIS — L821 Other seborrheic keratosis: Secondary | ICD-10-CM | POA: Diagnosis not present

## 2023-07-27 DIAGNOSIS — D229 Melanocytic nevi, unspecified: Secondary | ICD-10-CM | POA: Diagnosis not present

## 2023-07-27 DIAGNOSIS — L570 Actinic keratosis: Secondary | ICD-10-CM | POA: Diagnosis not present

## 2023-07-27 DIAGNOSIS — L814 Other melanin hyperpigmentation: Secondary | ICD-10-CM | POA: Diagnosis not present

## 2023-08-13 DIAGNOSIS — G4733 Obstructive sleep apnea (adult) (pediatric): Secondary | ICD-10-CM | POA: Diagnosis not present

## 2023-09-01 ENCOUNTER — Ambulatory Visit (INDEPENDENT_AMBULATORY_CARE_PROVIDER_SITE_OTHER): Payer: BLUE CROSS/BLUE SHIELD | Admitting: Internal Medicine

## 2023-09-01 ENCOUNTER — Encounter: Payer: Self-pay | Admitting: Internal Medicine

## 2023-09-01 VITALS — BP 98/62 | HR 51 | Temp 97.8°F | Resp 16 | Ht 71.0 in | Wt 193.1 lb

## 2023-09-01 DIAGNOSIS — Z0000 Encounter for general adult medical examination without abnormal findings: Secondary | ICD-10-CM | POA: Diagnosis not present

## 2023-09-01 DIAGNOSIS — F5104 Psychophysiologic insomnia: Secondary | ICD-10-CM

## 2023-09-01 DIAGNOSIS — E785 Hyperlipidemia, unspecified: Secondary | ICD-10-CM | POA: Diagnosis not present

## 2023-09-01 DIAGNOSIS — F419 Anxiety disorder, unspecified: Secondary | ICD-10-CM | POA: Diagnosis not present

## 2023-09-01 DIAGNOSIS — R739 Hyperglycemia, unspecified: Secondary | ICD-10-CM

## 2023-09-01 DIAGNOSIS — Z0001 Encounter for general adult medical examination with abnormal findings: Secondary | ICD-10-CM

## 2023-09-01 NOTE — Patient Instructions (Addendum)
Vaccines I recommend: Covid booster    Check the  blood pressure regularly Blood pressure goal:  between 100/65 and  135/85. If it is consistently higher or lower, let me know     GO TO THE LAB : Get the blood work     Next visit with me in 6 months    Please schedule it at the front desk       "Health Care Power of attorney" ,  "Living will" (Advance care planning documents)  If you already have a living will or healthcare power of attorney, is recommended you bring the copy to be scanned in your chart.   The document will be available to all the doctors you see in the system.  Advance care planning is a process that supports adults in  understanding and sharing their preferences regarding future medical care.  The patient's preferences are recorded in documents called Advance Directives and the can be modified at any time while the patient is in full mental capacity.   If you don't have one, please consider create one.      More information at: [email or web address]

## 2023-09-01 NOTE — Progress Notes (Unsigned)
Subjective:    Patient ID: Joe Blanchard, male    DOB: 1960-09-18, 63 y.o.   MRN: [id number]  DOS:  09/01/2023 Type of visit - description: cpx  Here for CPX Since the last office visit is doing well. BP has been low for the last few days, not on any BP meds, denies any symptoms such as dizziness, weakness or fatigue.  Wt Readings from Last 3 Encounters:  09/01/23 193 lb 2 oz (87.6 kg)  02/28/23 184 lb (83.5 kg)  01/11/23 187 lb (84.8 kg)     Review of Systems See above   Past Medical History:  Diagnosis Date   Anxiety    Arthritis    Colon polyps    Cscope 11-11, next 06-2011   Hypertension    on meds    Hypertriglyceridemia    a. 11/2013 - 240.   Prediabetes 06/24/2011   Sleep apnea    will start CPAP once can get a machine     Past Surgical History:  Procedure Laterality Date   COLONOSCOPY     KNEE SURGERY  12/03/08   scope   POLYPECTOMY     SCALP LACERATION REPAIR  ~09-2013    Current Outpatient Medications  Medication Instructions   acetaminophen (TYLENOL) 500 mg, Every 6 hours PRN   aspirin EC 81 mg, Daily   atorvastatin (LIPITOR) 40 mg, Oral, Daily at bedtime   DULoxetine (CYMBALTA) 30 mg, Oral, Daily   ibuprofen (ADVIL) 200 mg, 2 times daily PRN   zolpidem (AMBIEN) 10 MG tablet TAKE HALF TO ONE TABLET BY MOUTH AT BEDTIME AS NEEDED       Objective:   Physical Exam BP 98/62   Pulse (!) 51   Temp 97.8 F (36.6 C) (Oral)   Resp 16   Ht 5\' 11"  (1.803 m)   Wt 193 lb 2 oz (87.6 kg)   SpO2 97%   BMI 26.94 kg/m  General: Well developed, NAD, BMI noted Neck: No  thyromegaly  HEENT:  Normocephalic . Face symmetric, atraumatic Lungs:  CTA B Normal respiratory effort, no intercostal retractions, no accessory muscle use. Heart: RRR,  no murmur.  Abdomen:  Not distended, soft, non-tender. No rebound or rigidity.   Lower extremities: no pretibial edema bilaterally  Skin: Exposed areas without rash. Not pale. Not jaundice Neurologic:  alert &  oriented X3.  Speech normal, gait appropriate for age and unassisted Strength symmetric and appropriate for age.  Psych: Cognition and judgment appear intact.  Cooperative with normal attention span and concentration.  Behavior appropriate. No anxious or depressed appearing.     Assessment   Assessment: Prediabetes HTN: DX 07/2020 Dyslipidemia, high TG DJD  Anxiety, insomnia: 2012: Rx citalopram --> switch to prozac 09-2012 d/t fatigue and no improvment, switch to cymbalta 08-2014 w/ great results Insomnia (on ambien and/or xanax prn) Hypogonadism- saw endo 2014, took clomid, T level normalized but pt d/c meds as he did not feel subjectively better  Severe OSA, HST 08/19/2020 , ~ 100% compliance w/ cpap CV:  -Stress test (03-2021): EF was 50% with apical hypokinesia, no evidence of ischemia, saw cardiology.  -Coronary calcium score (06-2021): >  75 percentile  -CT coronary morphology with CTA 06-2021.  See results.  Medical management Sees dermatology PLAN Here for CPX - Tdap 2023 - had shingrix x 2 -Had a flu shot. - Recommend to consider COVID booster.   - (+) FH of prostate cancer no symptoms, DRE was normal last year, no symptoms,  check PSA. - (+)  FH colon cancer, Cscope 06-2010,  08-2011, 06-2015 >> neg.  Last colonoscopy 09/24/2020.  Next 2027 per GI letter   - Labs:   BMP A1c TSH PSA --Diet exercise: Encouraged healthy diet, still recuperating from bilateral knee replacement last years, engaging in physical activity. - Healthcare POA: See AVS -Hyperglycemia: Check A1c. HTN: Not on meds, BP today 98/62, he is completely asymptomatic.  Recommend to keep checking BPs, if they are persistently low he has symptoms let me know. Dyslipidemia: Well-controlled on atorvastatin. Night sweats: See last office visit, resolved. Anxiety insomnia: He forgot to take Cymbalta for a while and felt okay so he stopped it several weeks ago.  Wonders if he needs to go back on it.  Occasionally get  more emotional than before and his joints are perhaps slightly more achy but overall feels good.  We agreed on observation, if anxiety and aches increase significantly, he will benefit from going back on Cymbalta. Continue Ambien RTC 6 months    Rash: See LOV: Quickly resolved HTN: BP is very good, reports similar ambulatory BPs, lifestyle controlled. Dyslipidemia: On atorvastatin, LDL goal 70, last LDL above goal.  Check FLP. Back pain: Since he had left TKR has developed pain located at the left SI/buttock area.  Etiology not completely clear, no fever, recommend to discuss with Ortho, has a follow-up next week Night sweats: new issue. Without fever, + weight loss due to better lifestyle.  No lymphadenopathies.  Will check a CBC with blood smear.  If lab is satisfactory, she will keep me posted, if symptoms continue would proceed with further evaluation. RTC 08-2023 CPX

## 2023-09-02 LAB — BASIC METABOLIC PANEL
BUN: 18 mg/dL (ref 7–25)
CO2: 25 mmol/L (ref 20–32)
Calcium: 9.8 mg/dL (ref 8.6–10.3)
Chloride: 103 mmol/L (ref 98–110)
Creat: 0.79 mg/dL (ref 0.70–1.35)
Glucose, Bld: 93 mg/dL (ref 65–99)
Potassium: 4.2 mmol/L (ref 3.5–5.3)
Sodium: 140 mmol/L (ref 135–146)

## 2023-09-02 LAB — PSA: PSA: 0.88 ng/mL (ref <=4.00)

## 2023-09-02 LAB — HEMOGLOBIN A1C
Hgb A1c MFr Bld: 5.9 %{Hb} — ABNORMAL HIGH (ref <5.7)
Mean Plasma Glucose: 123 mg/dL
eAG (mmol/L): 6.8 mmol/L

## 2023-09-02 LAB — TSH: TSH: 2.46 m[IU]/L (ref 0.40–4.50)

## 2023-09-03 ENCOUNTER — Encounter: Payer: Self-pay | Admitting: Internal Medicine

## 2023-09-03 NOTE — Assessment & Plan Note (Signed)
Here for CPX, chronic medical problems also addressed  Hyperglycemia: Check A1c. HTN: Not on meds, BP today 98/62, he is completely asymptomatic.  Recommend to keep checking BPs, if they are persistently low w/ sxs--- let me know. Dyslipidemia: Well-controlled on atorvastatin. Night sweats: See LOV-- resolved. Anxiety insomnia: He forgot to take Cymbalta for a while and felt okay so he stopped it several weeks ago.  Wonders if he needs to go back on it.  Occasionally get more emotional than before and his joints are perhaps slightly more achy but overall feels good.  We agreed on observation, if anxiety and aches increase significantly, he will benefit from going back on Cymbalta. Continue Ambien RTC 6 months

## 2023-09-03 NOTE — Assessment & Plan Note (Signed)
Here for CPX Hyperglycemia: Check A1c. HTN: Not on meds, BP today 98/62, he is completely asymptomatic.  Recommend to keep checking BPs, if they are persistently low w/ sxs--- let me know. Dyslipidemia: Well-controlled on atorvastatin. Night sweats: See LOV-- resolved. Anxiety insomnia: He forgot to take Cymbalta for a while and felt okay so he stopped it several weeks ago.  Wonders if he needs to go back on it.  Occasionally get more emotional than before and his joints are perhaps slightly more achy but overall feels good.  We agreed on observation, if anxiety and aches increase significantly, he will benefit from going back on Cymbalta. Continue Ambien RTC 6 months

## 2023-10-23 ENCOUNTER — Telehealth: Payer: Self-pay

## 2023-10-23 MED ORDER — ZOLPIDEM TARTRATE 10 MG PO TABS: 5.0000 mg | ORAL_TABLET | Freq: Every evening | ORAL | 4 refills | Status: DC | PRN

## 2023-10-23 NOTE — Telephone Encounter (Signed)
 Requesting: Ambien 10mg   Contract: 08/30/22 UDS: 08/30/22 Last Visit: 09/01/23 Next Visit: 03/01/24 Last Refill: 04/13/23 #30 and 4RF   Please Advise

## 2023-10-23 NOTE — Telephone Encounter (Signed)
 PDMP okay, Rx sent

## 2023-10-23 NOTE — Telephone Encounter (Signed)
 Copied from CRM [id number]. Topic: Clinical - Prescription Issue >> Oct 23, 2023  3:11 PM Fonda Kinder J wrote: Reason for CRM: Pt is requesting for his prescription for zolpidem (AMBIEN) 10 MG tablet be sent to the updated pharmacy on file. [facility] [phone]

## 2023-11-26 ENCOUNTER — Other Ambulatory Visit: Payer: Self-pay | Admitting: Internal Medicine

## 2024-02-17 ENCOUNTER — Encounter: Payer: Self-pay | Admitting: Internal Medicine

## 2024-02-19 MED ORDER — ZOLPIDEM TARTRATE 10 MG PO TABS: 5.0000 mg | ORAL_TABLET | Freq: Every evening | ORAL | 4 refills | Status: AC | PRN

## 2024-02-19 NOTE — Telephone Encounter (Signed)
 PDMP okay, Rx sent

## 2024-02-26 ENCOUNTER — Ambulatory Visit (INDEPENDENT_AMBULATORY_CARE_PROVIDER_SITE_OTHER): Admitting: Internal Medicine

## 2024-02-26 ENCOUNTER — Encounter: Payer: Self-pay | Admitting: Internal Medicine

## 2024-02-26 VITALS — BP 128/86 | HR 48 | Temp 98.1°F | Resp 16 | Ht 71.0 in | Wt 188.5 lb

## 2024-02-26 DIAGNOSIS — R739 Hyperglycemia, unspecified: Secondary | ICD-10-CM | POA: Diagnosis not present

## 2024-02-26 DIAGNOSIS — I10 Essential (primary) hypertension: Secondary | ICD-10-CM | POA: Diagnosis not present

## 2024-02-26 DIAGNOSIS — E785 Hyperlipidemia, unspecified: Secondary | ICD-10-CM

## 2024-02-26 DIAGNOSIS — G4733 Obstructive sleep apnea (adult) (pediatric): Secondary | ICD-10-CM

## 2024-02-26 DIAGNOSIS — F419 Anxiety disorder, unspecified: Secondary | ICD-10-CM

## 2024-02-26 DIAGNOSIS — F5104 Psychophysiologic insomnia: Secondary | ICD-10-CM

## 2024-02-26 NOTE — Patient Instructions (Signed)
 Vaccines I recommend: Flu shot every fall COVID booster is a consideration  Check the  blood pressure regularly Blood pressure goal:  between 110/65 and  135/85. If it is consistently higher or lower, let me know     GO TO THE LAB :  Get the blood work   Your results will be posted on MyChart with my comments  Next office visit for a physical exam by January 2026 Please make an appointment before you leave today

## 2024-02-26 NOTE — Assessment & Plan Note (Signed)
 Hyperglycemia: Check A1c High cholesterol, on atorvastatin , checking labs. Anxiety insomnia: Since last visit went back on Cymbalta  because he realizes it definitely helps w/ aches and pains.  Also helps shutting down his thoughts at bedtime.  Doing great now.  RF when needed OSA: 100% CPAP use DJD: Had TKR bilaterally last year, doing great. Social: Retired, very active, would like to get some muscle mass back, advise to do some mild lifting at the gym. Vaccine advice provided RTC 6 months CPX

## 2024-02-26 NOTE — Progress Notes (Signed)
   Subjective:    Patient ID: Joe Blanchard, male    DOB: 08/24/1960, 63 y.o.   MRN: [id number]  DOS:  02/26/2024 Type of visit - description: follow up  Chronic medical problems addressed. He is back on Cymbalta  feeling well.  Review of Systems See above   Past Medical History:  Diagnosis Date   Anxiety    Arthritis    Colon polyps    Cscope 11-11, next 06-2011   Hypertension    on meds    Hypertriglyceridemia    a. 11/2013 - 240.   Prediabetes 06/24/2011   Sleep apnea    will start CPAP once can get a machine     Past Surgical History:  Procedure Laterality Date   COLONOSCOPY     KNEE SURGERY  12/03/2008   scope   POLYPECTOMY     SCALP LACERATION REPAIR  ~09-2013   TOTAL KNEE ARTHROPLASTY Left 12/2022   TOTAL KNEE ARTHROPLASTY Right 03/2023    Current Outpatient Medications  Medication Instructions   acetaminophen  (TYLENOL ) 500 mg, Every 6 hours PRN   aspirin  EC 81 mg, Daily   atorvastatin  (LIPITOR) 40 mg, Oral, Daily at bedtime   DULoxetine  (CYMBALTA ) 30 mg, Daily   ibuprofen (ADVIL) 200 mg, 2 times daily PRN   zolpidem  (AMBIEN ) 5-10 mg, Oral, At bedtime PRN       Objective:   Physical Exam BP 128/86   Pulse (!) 48   Temp 98.1 F (36.7 C) (Oral)   Resp 16   Ht 5' 11 (1.803 m)   Wt 188 lb 8 oz (85.5 kg)   SpO2 98%   BMI 26.29 kg/m  General:   Well developed, NAD, BMI noted. HEENT:  Normocephalic . Face symmetric, atraumatic Lungs:  CTA B Normal respiratory effort, no intercostal retractions, no accessory muscle use. Heart: RRR,  no murmur.  Lower extremities: no pretibial edema bilaterally  Skin: Not pale. Not jaundice Neurologic:  alert & oriented X3.  Speech normal, gait appropriate for age and unassisted Psych--  Cognition and judgment appear intact.  Cooperative with normal attention span and concentration.  Behavior appropriate. No anxious or depressed appearing.      Assessment     Assessment: Prediabetes HTN: DX  07/2020 Dyslipidemia, high TG DJD  Anxiety, insomnia: 2012: Rx citalopram  --> switch to prozac  09-2012 d/t fatigue and no improvment, switch to cymbalta  08-2014 w/ great results Insomnia (on ambien  and/or xanax  prn) Hypogonadism- saw endo 2014, took clomid , T level normalized but pt d/c meds as he did not feel subjectively better  Severe OSA, HST 08/19/2020 , ~ 100% compliance w/ cpap CV:  -Stress test (03-2021): EF was 50% with apical hypokinesia, no evidence of ischemia, saw cardiology.  -Coronary calcium  score (06-2021): >  75 percentile  -CT coronary morphology with CTA 06-2021.  See results.  Medical management Sees dermatology  PLAN Hyperglycemia: Check A1c High cholesterol, on atorvastatin , checking labs. Anxiety insomnia: Since last visit went back on Cymbalta  because he realizes it definitely helps w/ aches and pains.  Also helps shutting down his thoughts at bedtime.  Doing great now.  RF when needed OSA: 100% CPAP use DJD: Had TKR bilaterally last year, doing great. Social: Retired, very active, would like to get some muscle mass back, advise to do some mild lifting at the gym. Vaccine advice provided RTC 6 months CPX

## 2024-02-27 LAB — HEMOGLOBIN A1C
Hgb A1c MFr Bld: 5.7 % — ABNORMAL HIGH (ref <5.7)
Mean Plasma Glucose: 117 mg/dL
eAG (mmol/L): 6.5 mmol/L

## 2024-02-27 LAB — LIPID PANEL
Cholesterol: 110 mg/dL (ref <200)
HDL: 46 mg/dL (ref >=40)
LDL Cholesterol (Calc): 50 mg/dL
Non-HDL Cholesterol (Calc): 64 mg/dL (ref <130)
Total CHOL/HDL Ratio: 2.4 (calc) (ref <5.0)
Triglycerides: 59 mg/dL (ref <150)

## 2024-02-27 LAB — AST: AST: 20 U/L (ref 10–35)

## 2024-02-27 LAB — ALT: ALT: 41 U/L (ref 9–46)

## 2024-02-29 ENCOUNTER — Ambulatory Visit: Payer: Self-pay | Admitting: Internal Medicine

## 2024-03-01 ENCOUNTER — Ambulatory Visit: Payer: BLUE CROSS/BLUE SHIELD | Admitting: Internal Medicine

## 2024-04-23 ENCOUNTER — Other Ambulatory Visit: Payer: Self-pay | Admitting: Internal Medicine

## 2024-05-20 ENCOUNTER — Other Ambulatory Visit: Payer: Self-pay | Admitting: Internal Medicine

## 2024-07-23 ENCOUNTER — Encounter: Payer: Self-pay | Admitting: Adult Health

## 2024-07-23 ENCOUNTER — Ambulatory Visit: Payer: BLUE CROSS/BLUE SHIELD | Admitting: Adult Health

## 2024-07-23 VITALS — BP 104/69 | HR 62 | Ht 71.0 in | Wt 191.0 lb

## 2024-07-23 DIAGNOSIS — G4733 Obstructive sleep apnea (adult) (pediatric): Secondary | ICD-10-CM

## 2024-07-23 NOTE — Progress Notes (Signed)
 PATIENT: Joe Blanchard DOB: April 13, 1961  REASON FOR VISIT: follow up HISTORY FROM: patient  Chief Complaint  Patient presents with   Follow-up    Pt in 4 alone Pt here for cpap f/u Pt states wants to discuss new machine Pt aware not eligible for new machine until March 2027      HISTORY OF PRESENT ILLNESS: Today 07/23/24:  Joe Blanchard is a 63 y.o. male with a history of OSA on CPAP. Returns today for follow-up.  He reports that CPAP is working well for him.  He states that he does not like to sleep without it.  Uses the fullface mask.  Denies any new issues.        07/18/23: [name] is a 63 y.o. male with a history of OSA on CPAP. Returns today for follow-up.  Reports that the CPAP is doing well.  States that he does not like to sleep without it.  His download is below      07/12/22: [name] is a 63 year old male with a history of obstructive sleep apnea on CPAP.  He returns today for virtual visit.  His download indicates that he uses machine nightly for compliance of 100%.  On average he uses his machine 9 hours and 2 minutes.  His residual AHI is 1.3 on 6 to 18 cm of water with right pressure at 4 cm of water.  He reports that he would like a travel machine.  Willing to pay out-of-pocket for it.   REVIEW OF SYSTEMS: Out of a complete 14 system review of symptoms, the patient complains only of the following symptoms, and all other reviewed systems are negative.  ESS 8  ALLERGIES: No Known Allergies  HOME MEDICATIONS: Outpatient Medications Prior to Visit  Medication Sig Dispense Refill   acetaminophen  (TYLENOL ) 500 MG tablet Take 500 mg by mouth every 6 (six) hours as needed for mild pain. Reported on 09/23/2015     aspirin  EC 81 MG tablet Take 81 mg by mouth daily. Swallow whole.     atorvastatin  (LIPITOR) 40 MG tablet Take 1 tablet (40 mg total) by mouth at bedtime. 90 tablet 1   DULoxetine  (CYMBALTA ) 30 MG capsule Take 1 capsule  (30 mg total) by mouth daily. 90 capsule 1   ibuprofen (ADVIL) 200 MG tablet Take 200 mg by mouth 2 (two) times daily as needed for moderate pain.     zolpidem  (AMBIEN ) 10 MG tablet Take 0.5-1 tablets (5-10 mg total) by mouth at bedtime as needed for sleep. 30 tablet 4   No facility-administered medications prior to visit.    PAST MEDICAL HISTORY: Past Medical History:  Diagnosis Date   Anxiety    Arthritis    Colon polyps    Cscope 11-11, next 06-2011   Hypertension    on meds    Hypertriglyceridemia    a. 11/2013 - 240.   Prediabetes 06/24/2011   Sleep apnea    will start CPAP once can get a machine     PAST SURGICAL HISTORY: Past Surgical History:  Procedure Laterality Date   COLONOSCOPY     KNEE SURGERY  12/03/2008   scope   POLYPECTOMY     SCALP LACERATION REPAIR  ~09-2013   TOTAL KNEE ARTHROPLASTY Left 12/2022   TOTAL KNEE ARTHROPLASTY Right 03/2023    FAMILY HISTORY: Family History  Problem Relation Age of Onset   Other Mother    Dementia Mother    Colon  cancer Father        dx in his 34s   Prostate cancer Father        dx in his 48s?   Other Father        died suddenly @ 48.   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           Social Drivers of Corporate Investment Banker Strain: Low Risk  (02/19/2024)   Overall Financial Resource Strain (CARDIA)    Difficulty of Paying Living Expenses: Not very hard  Food Insecurity: No Food Insecurity (02/19/2024)   Hunger Vital Sign    Worried About Running Out of Food in the Last Year: Never true    Ran Out of Food in the Last Year: Never true  Transportation Needs: No Transportation Needs (02/19/2024)   PRAPARE - Administrator, Civil Service (Medical): No    Lack of Transportation (Non-Medical): No  Physical Activity: Insufficiently Active (02/19/2024)   Exercise Vital Sign    Days of Exercise per Week: 4 days    Minutes of Exercise per Session: 30 min  Stress: Stress Concern Present (02/19/2024)   [employer] of Occupational Health - Occupational Stress Questionnaire    Feeling of Stress: To some extent  Social Connections: Socially Integrated (02/19/2024)   Social Connection and Isolation Panel    Frequency of Communication with Friends and Family: More than three times a week    Frequency of Social Gatherings with Friends and Family: Three times a week    Attends Religious Services: More than 4 times per year    Active Member of Clubs or Organizations: Yes    Attends Engineer, Structural: More than 4 times per year    Marital Status: Married  Catering Manager Violence: Not on file      PHYSICAL EXAM Generalized: Well developed, in no acute distress   Neurological examination  Mentation: Alert oriented to time, place, history taking. Follows all commands speech and language fluent Cranial nerve II-XII: Facial symmetry noted   DIAGNOSTIC DATA (LABS, IMAGING, TESTING) - I reviewed patient  records, labs, notes, testing and imaging  myself where available.  Lab Results  Component Value Date   WBC 4.7 02/28/2023   HGB 13.9 02/28/2023   HCT 43.2 02/28/2023   MCV 86.9 02/28/2023   PLT 203 02/28/2023      Component Value Date/Time   NA 140 09/01/2023 1324   NA 140 06/11/2021 1427   K 4.2 09/01/2023 1324   CL 103 09/01/2023 1324   CO2 25 09/01/2023 1324   GLUCOSE 93 09/01/2023 1324   GLUCOSE 101 12/16/2008 0000   BUN 18 09/01/2023 1324   BUN 19 06/11/2021 1427   CREATININE 0.79 09/01/2023 1324   CALCIUM  9.8 09/01/2023 1324   PROT 7.4 02/28/2023 1030   PROT 7.3 06/17/2020 1427   ALBUMIN 4.7 06/17/2020 1427   AST 20 02/26/2024 1326   ALT 41 02/26/2024 1326   ALKPHOS 95 06/17/2020 1427   BILITOT 0.6 02/28/2023 1030   BILITOT 0.5 06/17/2020 1427   GFRNONAA 94 06/17/2020 1427   GFRAA 109 06/17/2020 1427   Lab Results  Component Value Date   CHOL 110 02/26/2024   HDL 46 02/26/2024   LDLCALC 50 02/26/2024   LDLDIRECT 143.0 06/04/2018   TRIG 59 02/26/2024   CHOLHDL 2.4 02/26/2024   Lab Results  Component Value Date   HGBA1C 5.7 (H) 02/26/2024   Lab Results  Component Value Date   VITAMINB12 567 02/03/2016   Lab Results  Component Value Date   TSH 2.46 09/01/2023      ASSESSMENT AND PLAN 63 y.o. year old male  has a past medical history of Anxiety, Arthritis, Colon polyps, Hypertension, Hypertriglyceridemia, Prediabetes (06/24/2011), and Sleep apnea. here with:  OSA on CPAP  CPAP compliance excellent Residual AHI is good Encouraged patient to continue using CPAP nightly and > 4 hours each night F/U in 1 year or sooner if needed    Duwaine Russell, MSN, NP-C 07/23/2024, 10:29 AM [facility] [address], [address] [place], [place] [address] [phone]

## 2024-07-23 NOTE — Patient Instructions (Signed)
 Continue using CPAP nightly and greater than 4 hours each night If your symptoms worsen or you develop new symptoms please let us  know.

## 2024-09-03 ENCOUNTER — Ambulatory Visit (INDEPENDENT_AMBULATORY_CARE_PROVIDER_SITE_OTHER): Admitting: Internal Medicine

## 2024-09-03 ENCOUNTER — Encounter: Payer: Self-pay | Admitting: Internal Medicine

## 2024-09-03 VITALS — BP 126/80 | HR 51 | Temp 98.1°F | Resp 16 | Ht 71.0 in | Wt 187.4 lb

## 2024-09-03 DIAGNOSIS — R739 Hyperglycemia, unspecified: Secondary | ICD-10-CM | POA: Diagnosis not present

## 2024-09-03 DIAGNOSIS — E781 Pure hyperglyceridemia: Secondary | ICD-10-CM | POA: Diagnosis not present

## 2024-09-03 DIAGNOSIS — I10 Essential (primary) hypertension: Secondary | ICD-10-CM | POA: Diagnosis not present

## 2024-09-03 DIAGNOSIS — Z0000 Encounter for general adult medical examination without abnormal findings: Secondary | ICD-10-CM | POA: Diagnosis not present

## 2024-09-03 NOTE — Progress Notes (Unsigned)
 "  Subjective:    Patient ID: Joe Blanchard, male    DOB: Sep 29, 1960, 64 y.o.   MRN: [id number]  DOS:  09/03/2024 CPX Discussed the use of AI scribe software for clinical note transcription with the patient, who gave verbal consent to proceed.  History of Present Illness Joe Blanchard is a 64 year old male who presents for an annual physical exam.  General health status - Feels well with no new concerns - Follows a mostly fish- and vegetable-based diet - Remains active with regular physical work, but does not participate in formal gym exercise  Obstructive sleep apnea - Uses CPAP nightly  Chronic joint pain and post-surgical knee pain - History of prior knee surgery - Uses Advil and Tylenol  as needed for pain - Takes Cymbalta  daily, which helps with joint pain - Voltaren gel has not provided relief for knee pain  Bunion-related toe pain - Experiences toe pain related to bunion, worse with putting on socks or flexing the toe - Considering trying Voltaren gel on the toe  Sleep disturbance - Uses Ambien , usually half a tablet at night, for sleep  Cardiovascular and gastrointestinal symptoms - No recent chest pain, dyspnea, nausea, vomiting, or hematochezia  Urinary symptoms - No recent urinary symptoms  Cancer screening awareness - Family history of prostate cancer - Aware of the need for regular PSA testing   Review of Systems  Other than above, a 14 point review of systems is negative   Past Medical History:  Diagnosis Date   Anxiety    Arthritis    Colon polyps    Cscope 11-11, next 06-2011   Hypertension    on meds    Hypertriglyceridemia    a. 11/2013 - 240.   Prediabetes 06/24/2011   Sleep apnea    will start CPAP once can get a machine     Past Surgical History:  Procedure Laterality Date   COLONOSCOPY     KNEE SURGERY  12/03/2008   scope   POLYPECTOMY     SCALP LACERATION REPAIR  ~09-2013   TOTAL KNEE ARTHROPLASTY Left 12/2022    TOTAL KNEE ARTHROPLASTY Right 03/2023    Current Outpatient Medications  Medication Instructions   acetaminophen  (TYLENOL ) 500 mg, Every 6 hours PRN   aspirin  EC 81 mg, Daily   atorvastatin  (LIPITOR) 40 mg, Oral, Daily at bedtime   DULoxetine  (CYMBALTA ) 30 mg, Oral, Daily   ibuprofen (ADVIL) 200 mg, 2 times daily PRN   zolpidem  (AMBIEN ) 5-10 mg, Oral, At bedtime PRN       Objective:   Physical Exam BP 126/80   Pulse (!) 51   Temp 98.1 F (36.7 C) (Oral)   Resp 16   Ht 5' 11 (1.803 m)   Wt 187 lb 6 oz (85 kg)   SpO2 98%   BMI 26.13 kg/m  General: Well developed, NAD, BMI noted Neck: No  thyromegaly  HEENT:  Normocephalic . Face symmetric, atraumatic Lungs:  CTA B Normal respiratory effort, no intercostal retractions, no accessory muscle use. Heart: RRR,  no murmur.  Abdomen:  Not distended, soft, non-tender. No rebound or rigidity.   Lower extremities: no pretibial edema bilaterally.  Has bunions, no redness, skin normal. Skin: Exposed areas without rash. Not pale. Not jaundice Neurologic:  alert & oriented X3.  Speech normal, gait appropriate for age and unassisted Strength symmetric and appropriate for age.  Psych: Cognition and judgment appear intact.  Cooperative with normal attention span and concentration.  Behavior appropriate. No anxious or depressed appearing.     Assessment   Assessment: Prediabetes HTN: DX 07/2020 Dyslipidemia, high TG DJD  Anxiety, insomnia: 2012: Rx citalopram  --> switch to prozac  09-2012 d/t fatigue and no improvment, switch to cymbalta  08-2014 w/ great results Insomnia (on ambien  and/or xanax  prn) Hypogonadism- saw endo 2014, took clomid , T level normalized but pt d/c meds as he did not feel subjectively better  Severe OSA, HST 08/19/2020 , ~ 100% compliance w/ cpap CV:  -Stress test (03-2021): EF was 50% with apical hypokinesia, no evidence of ischemia, saw cardiology.  -Coronary calcium  score (06-2021): >  75 percentile   -CT coronary morphology with CTA 06-2021.  See results.  Medical management Sees dermatology Assessment & Plan Here for CPX -Tdap 2023 - had shingrix x 2 - Had a flu shot - Recommend a COVID booster - (+) FH of prostate cancer no symptoms,   check PSA. - (+)  FH colon cancer, Cscope 06-2010,  08-2011, 06-2015 >> neg.  Last colonoscopy 09/24/2020.  Next 2027 per GI letter   - Labs:   See orders --Diet exercise: Doing well, eating healthy, he remains very active.  Other issues Prediabetes: Healthy lifestyle, check A1c DJD Patient reports Cymbalta  helps with DJD pain.  Has a bunion, recommend Voltaren gel as needed.  He will call for orthopedic referral when/if needed. Anxiety, insomnia: Chronic insomnia managed with Ambien  half tablet daily and takes Cymbalta .. OSA:- Continue regular CPAP use. Hyperlipidemia continue atorvastatin , check FLP RTC 1 year, CPX    "

## 2024-09-03 NOTE — Patient Instructions (Signed)
 Please read your instructions carefully.   GO TO THE LAB :  Get the blood work    Go to the front desk for the checkout Please make an appointment for your next physical exam in 1 year   Consider a COVID-vaccine     ANNUAL PREVENTIVE HEALTH EVALUATION: Routine checkup with stable vitals and no new concerns. Vaccinations are current except for the COVID booster. -Get the COVID booster at the pharmacy. -PSA test has been ordered. -Blood tests (CMP, liver, kidney, potassium, cholesterol, CBC, A1c) have been ordered. -Colonoscopy scheduled due next year.  OSTEOARTHRITIS: Chronic joint pain managed with Cymbalta . Bunion causing intermittent toe pain. -Continue taking Cymbalta  for joint pain. -Apply Voltaren gel to the toe 2-3 times daily as needed for pain. - When ready, call for a orthopedic referral.  INSOMNIA: Chronic insomnia managed with Ambien . -Continue taking Ambien  as needed for sleep.  OBSTRUCTIVE SLEEP APNEA: Managed with nightly CPAP use. -Continue using CPAP regularly at night.  HYPERLIPIDEMIA: Cholesterol levels within normal limits on atorvastatin . -Continue taking atorvastatin . -Cholesterol levels will be rechecked.

## 2024-09-04 ENCOUNTER — Encounter: Payer: Self-pay | Admitting: Internal Medicine

## 2024-09-04 LAB — CBC WITH DIFFERENTIAL/PLATELET
Absolute Lymphocytes: 1338 {cells}/uL (ref 850–3900)
Absolute Monocytes: 372 {cells}/uL (ref 200–950)
Basophils Absolute: 30 {cells}/uL (ref 0–200)
Basophils Relative: 0.5 %
Eosinophils Absolute: 30 {cells}/uL (ref 15–500)
Eosinophils Relative: 0.5 %
HCT: 44.7 % (ref 39.4–51.1)
Hemoglobin: 14.7 g/dL (ref 13.2–17.1)
MCH: 29 pg (ref 27.0–33.0)
MCHC: 32.9 g/dL (ref 31.6–35.4)
MCV: 88.2 fL (ref 81.4–101.7)
MPV: 10.9 fL (ref 7.5–12.5)
Monocytes Relative: 6.2 %
Neutro Abs: 4230 {cells}/uL (ref 1500–7800)
Neutrophils Relative %: 70.5 %
Platelets: 192 Thousand/uL (ref 140–400)
RBC: 5.07 Million/uL (ref 4.20–5.80)
RDW: 12.3 % (ref 11.0–15.0)
Total Lymphocyte: 22.3 %
WBC: 6 Thousand/uL (ref 3.8–10.8)

## 2024-09-04 LAB — COMPREHENSIVE METABOLIC PANEL WITH GFR
AG Ratio: 2.2 (calc) (ref 1.0–2.5)
ALT: 45 U/L (ref 9–46)
AST: 23 U/L (ref 10–35)
Albumin: 4.8 g/dL (ref 3.6–5.1)
Alkaline phosphatase (APISO): 55 U/L (ref 35–144)
BUN: 18 mg/dL (ref 7–25)
CO2: 30 mmol/L (ref 20–32)
Calcium: 9.5 mg/dL (ref 8.6–10.3)
Chloride: 102 mmol/L (ref 98–110)
Creat: 0.84 mg/dL (ref 0.70–1.35)
Globulin: 2.2 g/dL (ref 1.9–3.7)
Glucose, Bld: 101 mg/dL — ABNORMAL HIGH (ref 65–99)
Potassium: 4.1 mmol/L (ref 3.5–5.3)
Sodium: 140 mmol/L (ref 135–146)
Total Bilirubin: 0.7 mg/dL (ref 0.2–1.2)
Total Protein: 7 g/dL (ref 6.1–8.1)
eGFR: 98 mL/min/1.73m2

## 2024-09-04 LAB — HEMOGLOBIN A1C
Hgb A1c MFr Bld: 5.5 %
Mean Plasma Glucose: 111 mg/dL
eAG (mmol/L): 6.2 mmol/L

## 2024-09-04 LAB — LIPID PANEL
Cholesterol: 103 mg/dL
HDL: 43 mg/dL
LDL Cholesterol (Calc): 39 mg/dL
Non-HDL Cholesterol (Calc): 60 mg/dL
Total CHOL/HDL Ratio: 2.4 (calc)
Triglycerides: 130 mg/dL

## 2024-09-04 LAB — PSA: PSA: 1.11 ng/mL

## 2024-09-04 NOTE — Assessment & Plan Note (Signed)
 Here for CPX  Other issues Prediabetes: Healthy lifestyle, check A1c DJD Patient reports Cymbalta  helps with DJD pain.  Has a bunion, recommend Voltaren gel as needed.  He will call for orthopedic referral when/if needed. Anxiety, insomnia: Chronic insomnia managed with Ambien  half tablet daily and takes Cymbalta .. OSA:- Continue regular CPAP use. Hyperlipidemia continue atorvastatin , check FLP RTC 1 year, CPX

## 2024-09-04 NOTE — Assessment & Plan Note (Signed)
 Here for CPX -Tdap 2023 - had shingrix x 2 - Had a flu shot - Recommend a COVID booster - (+) FH of prostate cancer no symptoms,   check PSA. - (+)  FH colon cancer, Cscope 06-2010,  08-2011, 06-2015 >> neg.  Last colonoscopy 09/24/2020.  Next 2027 per GI letter   - Labs:   See orders --Diet exercise: Doing well, eating healthy, he remains very active.

## 2024-09-05 ENCOUNTER — Ambulatory Visit: Payer: Self-pay | Admitting: Internal Medicine

## 2025-07-28 ENCOUNTER — Ambulatory Visit: Admitting: Adult Health

## 2025-09-05 ENCOUNTER — Encounter: Admitting: Internal Medicine
